# Patient Record
Sex: Female | Born: 1950 | Hispanic: No | Marital: Married | Smoking: Never smoker
Health system: Southern US, Community
[De-identification: ages and names within clinical notes are randomized; demographics above are authoritative.]

## PROBLEM LIST (undated history)

## (undated) DIAGNOSIS — I10 Essential (primary) hypertension: Secondary | ICD-10-CM

## (undated) DIAGNOSIS — I2510 Atherosclerotic heart disease of native coronary artery without angina pectoris: Secondary | ICD-10-CM

## (undated) HISTORY — PX: CARDIAC CATHETERIZATION: SHX172

## (undated) HISTORY — PX: COLON SURGERY: SHX602

---

## 2010-02-18 ENCOUNTER — Ambulatory Visit: Payer: Self-pay | Admitting: Family Medicine

## 2010-02-18 DIAGNOSIS — I10 Essential (primary) hypertension: Secondary | ICD-10-CM

## 2010-02-18 DIAGNOSIS — R002 Palpitations: Secondary | ICD-10-CM

## 2010-09-26 NOTE — Assessment & Plan Note (Signed)
Summary: CHECK-UP AND MEDICATIONS/EVM   Vital Signs:  Patient Profile:   60 Years Old Female CC:      Check up. Medication evaluation. Height:     61.5 inches Weight:      211 pounds BMI:     39.36 Temp:     98.5 degrees F oral Pulse rate:   66 / minute Resp:     18 per minute BP sitting:   134 / 76  (left arm)  Pt. in pain?   yes    Location:   head    Intensity:   5    Type:       aching              Is Patient Diabetic? No  Does patient need assistance? Functional Status Self care Ambulation Normal    History of Present Illness History from: daughter Reason for visit: see chief complaint Chief Complaint: Check up. Medication evaluation. History of Present Illness: She is visiting from [place], daughter works at [employer] in admissions, and interprets (Pt does speak some [place] as I do so this helps)  Basically she left her medication for blood pressure, and it helped prevent heart from racing-and now this is happening and is uncomfortable..  She has had catherization and told she may need more surgery later.  She will be visiting here until August 9th.  She was taking 1 pill a day for blood pressure, and she does not know the name of this.   REVIEW OF SYSTEMS Constitutional Symptoms      Denies fever, chills, night sweats, weight loss, weight gain, and fatigue.  Eyes       Complains of glasses.      Denies change in vision, eye pain, eye discharge, contact lenses, and eye surgery. Ear/Nose/Throat/Mouth       Complains of ear pain and dizziness.      Denies hearing loss/aids, change in hearing, ear discharge, frequent runny nose, frequent nose bleeds, sinus problems, sore throat, hoarseness, and tooth pain or bleeding.  Respiratory       Denies dry cough, productive cough, wheezing, shortness of breath, asthma, bronchitis, and emphysema/COPD.  Cardiovascular       Complains of tires easily with exhertion.      Denies murmurs and chest pain.      Comments:  Palpitations at times, worse now that not taking medications.   Gastrointestinal       Denies stomach pain, nausea/vomiting, diarrhea, constipation, blood in bowel movements, and indigestion. Genitourniary       Denies painful urination, kidney stones, and loss of urinary control.    Past History:  Past medical, surgical, family and social histories (including risk factors) reviewed for relevance to current acute and chronic problems. Social history (including risk factors) reviewed for relevance to current acute and chronic problems.  Family History: Reviewed history and no changes required.  Social History: Reviewed history and no changes required.  Physical Exam General appearance: Pleasant Arabic female, well developed, well nourished, no acute distress Head: normocephalic, atraumatic Eyes: conjunctivae and lids normal Pupils: equal, round, reactive to light Neck: neck supple,  trachea midline, no masses Chest/Lungs: no rales, wheezes, or rhonchi bilateral, breath sounds equal without effort Heart: regular rate and  rhythm, no murmur Abdomen: soft, non-tender without obvious organomegaly Extremities: normal extremities Neurological: grossly intact and non-focal Skin: no obvious rashes or lesions MSE: oriented to time, place, and person Assessment New Problems: PALPITATIONS (ICD-785.1) HYPERTENSION, BENIGN ESSENTIAL (ICD-401.1)  Plan New Medications/Changes: ATENOLOL 50 MG TABS (ATENOLOL) 1/2 to 2 once daily for heart and bp.  #90 x 0, 02/18/2010, Kathrynn Running MD  New Orders: New Patient Level II [phone]  The patient and/or caregiver has been counseled thoroughly with regard to medications prescribed including dosage, schedule, interactions, rationale for use, and possible side effects and they verbalize understanding.  Diagnoses and expected course of recovery discussed and will return if not improved as expected or if the condition worsens. Patient and/or caregiver  verbalized understanding.   Prescriptions: ATENOLOL 50 MG TABS (ATENOLOL) 1/2 to 2 once daily for heart and bp.  #90 x 0   Entered and Authorized by:   Kathrynn Running MD   Signed by:   Kathrynn Running MD on 02/18/2010   Method used:   Electronically to        [facility]  [address] (retail)       [address], [address]       [place], [place]  [address]       Ph: [phone]       Fax: [phone]   RxID:   [id number]   Patient Instructions: 1)  She was told to sart with just 1/2 Atenolol a day, and can gradually increase to 2 if needed to prevent palpitations.   Orders Added: 1)  New Patient Level II [99202]   It was clearly explained to the patient that this [facility] is not intended to be a primary care clinic.  The patient is always better served by the continuity of care and the provider/patient relationships developed with their dedicated primary care provider.  The patient was told to be sure to follow up as soon as possible with their primary care provider to discuss treatments received and to receive further examination and testing.  The patient verbalized understanding. The will f/u with PCP ASAP.   The patient was informed that there is no on-call provider or services available at this clinic during off-hours (when the clinic is closed).  If the patient developed a problem or concern that required immediate attention, the patient was advised to go the the nearest available urgent care or emergency department for medical care.  The patient verbalized understanding.     The risks, benefits and possible side effects were clearly explained and discussed with the patient.  The patient verbalized clear understanding.  The patient was given instructions to return if symptoms don't improve, worsen or new changes develop.  If it is not during clinic hours and the patient cannot get back to this clinic then the patient was told to seek medical care at  an available urgent care or emergency department.  The patient verbalized understanding.    I have reviewed the above medical office visit documention, including diagnoses, history, medications, clinical lists, orders and plan of care.   Rodney Langton, MD, FAAFP  February 20, 2010

## 2017-01-08 ENCOUNTER — Encounter (HOSPITAL_COMMUNITY): Payer: Self-pay

## 2017-01-08 ENCOUNTER — Emergency Department (HOSPITAL_COMMUNITY)
Admission: EM | Admit: 2017-01-08 | Discharge: 2017-01-08 | Disposition: A | Discharge status: Home / Self Care | Payer: Self-pay | Attending: Physician Assistant | Admitting: Physician Assistant

## 2017-01-08 DIAGNOSIS — I10 Essential (primary) hypertension: Secondary | ICD-10-CM | POA: Insufficient documentation

## 2017-01-08 DIAGNOSIS — L97919 Non-pressure chronic ulcer of unspecified part of right lower leg with unspecified severity: Secondary | ICD-10-CM | POA: Insufficient documentation

## 2017-01-08 DIAGNOSIS — I2510 Atherosclerotic heart disease of native coronary artery without angina pectoris: Secondary | ICD-10-CM | POA: Insufficient documentation

## 2017-01-08 DIAGNOSIS — L03115 Cellulitis of right lower limb: Secondary | ICD-10-CM | POA: Insufficient documentation

## 2017-01-08 HISTORY — DX: Atherosclerotic heart disease of native coronary artery without angina pectoris: I25.10

## 2017-01-08 HISTORY — DX: Essential (primary) hypertension: I10

## 2017-01-08 LAB — COMPREHENSIVE METABOLIC PANEL
ALBUMIN: 3.6 g/dL (ref 3.5–5.0)
ALT: 27 U/L (ref 14–54)
ANION GAP: 9 (ref 5–15)
AST: 25 U/L (ref 15–41)
Alkaline Phosphatase: 100 U/L (ref 38–126)
BILIRUBIN TOTAL: 0.3 mg/dL (ref 0.3–1.2)
BUN: 14 mg/dL (ref 6–20)
CALCIUM: 9 mg/dL (ref 8.9–10.3)
CO2: 22 mmol/L (ref 22–32)
CREATININE: 0.62 mg/dL (ref 0.44–1.00)
Chloride: 108 mmol/L (ref 101–111)
GFR calc Af Amer: >60 mL/min (ref >60)
GFR calc non Af Amer: >60 mL/min (ref >60)
GLUCOSE: 100 mg/dL — AB (ref 65–99)
Potassium: 3.9 mmol/L (ref 3.5–5.1)
Sodium: 139 mmol/L (ref 135–145)
TOTAL PROTEIN: 7.2 g/dL (ref 6.5–8.1)

## 2017-01-08 LAB — CBC WITH DIFFERENTIAL/PLATELET
BASOS PCT: 0 %
Basophils Absolute: 0 10*3/uL (ref 0.0–0.1)
EOS ABS: 0.5 10*3/uL (ref 0.0–0.7)
Eosinophils Relative: 7 %
HEMATOCRIT: 39.4 % (ref 36.0–46.0)
Hemoglobin: 13 g/dL (ref 12.0–15.0)
Lymphocytes Relative: 35 %
Lymphs Abs: 2.8 10*3/uL (ref 0.7–4.0)
MCH: 29.2 pg (ref 26.0–34.0)
MCHC: 33 g/dL (ref 30.0–36.0)
MCV: 88.5 fL (ref 78.0–100.0)
MONO ABS: 0.8 10*3/uL (ref 0.1–1.0)
MONOS PCT: 10 %
Neutro Abs: 3.8 10*3/uL (ref 1.7–7.7)
Neutrophils Relative %: 48 %
Platelets: 284 10*3/uL (ref 150–400)
RBC: 4.45 MIL/uL (ref 3.87–5.11)
RDW: 13.9 % (ref 11.5–15.5)
WBC: 7.9 10*3/uL (ref 4.0–10.5)

## 2017-01-08 LAB — I-STAT CG4 LACTIC ACID, ED: Lactic Acid, Venous: 1.22 mmol/L (ref 0.5–1.9)

## 2017-01-08 MED ORDER — SODIUM CHLORIDE 0.9 % IV BOLUS (SEPSIS)
1000.0000 mL | Freq: Once | INTRAVENOUS | Status: AC
  Administered 2017-01-08: 1000 mL via INTRAVENOUS

## 2017-01-08 MED ORDER — OXYCODONE HCL 5 MG PO TABS
5.0000 mg | ORAL_TABLET | Freq: Once | ORAL | Status: AC
  Administered 2017-01-08: 5 mg via ORAL
  Filled 2017-01-08: qty 1

## 2017-01-08 MED ORDER — CLINDAMYCIN HCL 150 MG PO CAPS: 450.0000 mg | ORAL_CAPSULE | Freq: Three times a day (TID) | ORAL | 0 refills | Status: AC

## 2017-01-08 MED ORDER — CLINDAMYCIN HCL 150 MG PO CAPS: 450.0000 mg | ORAL_CAPSULE | Freq: Three times a day (TID) | ORAL | 0 refills | Status: DC

## 2017-01-08 MED ORDER — CLINDAMYCIN PHOSPHATE 600 MG/50ML IV SOLN
600.0000 mg | Freq: Once | INTRAVENOUS | Status: AC
  Administered 2017-01-08: 600 mg via INTRAVENOUS
  Filled 2017-01-08: qty 50

## 2017-01-08 NOTE — ED Triage Notes (Signed)
Per Pt and family, Pt is coming from home with complaints of right wound infection for multiples months. Pt is visiting family from [place]. Denies fever.

## 2017-01-08 NOTE — ED Provider Notes (Signed)
[facility]-EMERGENCY DEPT Provider Note   CSN: [id number] Arrival date & time: 01/08/17  1525     History   Chief Complaint Chief Complaint  Patient presents with  . Wound Infection    HPI Christina Dalton is a 66 y.o. female.  Patient is a 66 year old female with hypertension who presents to the ED with chronic right lower extremity wound. Patient is from [place] and is currently visiting for the next 6 months. Family members took her to the ED as right lower leg wound has not gotten better. Patient with tenderness around her right lower extremity with redness and drainage. Patient denies history of diabetes or chronic steroid use. Patient states that wound is getting slowly worse.   The history is provided by the patient, a relative and a caregiver.  Illness  This is a chronic problem. The current episode started more than 1 week ago (06/2016). The problem occurs constantly. The problem has been gradually worsening. Pertinent negatives include no chest pain, no abdominal pain, no headaches and no shortness of breath. Nothing aggravates the symptoms. Nothing relieves the symptoms.    Past Medical History:  Diagnosis Date  . Coronary artery disease   . Hypertension     Patient Active Problem List   Diagnosis Date Noted  . HYPERTENSION, BENIGN ESSENTIAL 02/18/2010  . PALPITATIONS 02/18/2010    Past Surgical History:  Procedure Laterality Date  . CARDIAC CATHETERIZATION      OB History    No data available       Home Medications    Prior to Admission medications   Not on File    Family History No family history on file.  Social History Social History  Substance Use Topics  . Smoking status: Never Smoker  . Smokeless tobacco: Never Used  . Alcohol use No     Allergies   Patient has no known allergies.   Review of Systems Review of Systems  Constitutional: Negative for chills and fever.  HENT: Negative for ear pain and sore throat.   Eyes: Negative for  pain and visual disturbance.  Respiratory: Negative for cough and shortness of breath.   Cardiovascular: Negative for chest pain and palpitations.  Gastrointestinal: Negative for abdominal pain and vomiting.  Genitourinary: Negative for dysuria and hematuria.  Musculoskeletal: Negative for arthralgias and back pain.  Skin: Positive for wound. Negative for color change and rash.  Neurological: Negative for seizures, syncope and headaches.  All other systems reviewed and are negative.    Physical Exam Updated Vital Signs ED Triage Vitals  Enc Vitals Group     BP 01/08/17 1846 (!) 156/72     Pulse Rate 01/08/17 1639 74     Resp 01/08/17 1639 18     Temp 01/08/17 1639 98.4 F (36.9 C)     Temp Source 01/08/17 1639 Oral     SpO2 01/08/17 1639 96 %     Weight 01/08/17 1639 250 lb (113.4 kg)     Height 01/08/17 1639 5' 1.5" (1.562 m)     Head Circumference --      Peak Flow --      Pain Score 01/08/17 1639 0     Pain Loc --      Pain Edu? --      Excl. in GC? --     Physical Exam  Constitutional: She appears well-developed and well-nourished. No distress.  HENT:  Head: Normocephalic and atraumatic.  Eyes: Conjunctivae are normal.  Neck: Neck supple.  Cardiovascular:  Normal rate and regular rhythm.   No murmur heard. Pulmonary/Chest: Effort normal and breath sounds normal. No respiratory distress. She has no wheezes.  Abdominal: Soft. There is no tenderness.  Musculoskeletal: She exhibits no edema.  Neurological: She is alert.  Skin: Skin is warm and dry.  Patient with right lower extremity wound with about 5 cm x 5 cm with ulceration through the skin and subcutaneous tissues. Patient has edema surrounding with redness.   Psychiatric: She has a normal mood and affect.  Nursing note and vitals reviewed.    ED Treatments / Results  Labs (all labs ordered are listed, but only abnormal results are displayed) Labs Reviewed  CBC WITH DIFFERENTIAL/PLATELET  COMPREHENSIVE  METABOLIC PANEL  I-STAT CG4 LACTIC ACID, ED    EKG  EKG Interpretation None       Radiology No results found.  Procedures Procedures (including critical care time)  Medications Ordered in ED Medications  sodium chloride 0.9 % bolus 1,000 mL (not administered)  clindamycin (CLEOCIN) IVPB 600 mg (not administered)  oxyCODONE (Oxy IR/ROXICODONE) immediate release tablet 5 mg (not administered)     Initial Impression / Assessment and Plan / ED Course  I have reviewed the triage vital signs and the nursing notes.  Pertinent labs & imaging results that were available during my care of the patient were reviewed by me and considered in my medical decision making (see chart for details).      Christina Dalton is a 66 year old female with history of hypertension who presents to the ED with right leg wound. Patient's vitals at time of arrival to the ED are unremarkable and patient is without fever. Patient is visiting from [place] and going to be here for the next several months with her daughters. Patient has right lower leg since November 2017 that has been treated by her primary care provider. Family are unaware of most of the medical workup from this wound. Patient denies history of diabetes or immunosuppression. Patient last treatment was a course of Augmentin. Patient denies fever, chills. Patient states that the wound has gotten worse and there is increased pain as well. On exam, patient with ulcerated wound to the right calf with surrounding erythema that is tender to palpation. There is some swelling around this wound as well. No eschar or necrotic looking skin. Picture of wound is in media file on chart. 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 Overall the wound does look clean. To further evaluate will get CBC, BMP, lactic acid. Patient to be given normal saline bolus and IV clindamycin. Will consult infectious disease for further  information.  Infectious disease recommends outpatient follow-up with dermatology for biopsy. Given no signs of systemic infection with patient with normal vitals and normal labs, we will discharge the patient home with prescription for clindamycin for possible cellulitis around what is likely cutaneous leishmaniasis. Discussed plan with patient at length and patient given number to follow-up with primary care provider. Message was left with case management to follow-up with patient to help facilitate this appointment. Patient also given [facility] or could call a local dermatologist. Patient given return precautions and was discharged from the ED in good condition.  Final Clinical Impressions(s) / ED Diagnoses   Final diagnoses:  Cellulitis of right lower extremity  Chronic ulcer of lower extremity, right, with unspecified severity (HCC)    New Prescriptions New Prescriptions   No medications on file     Virgina Norfolkuratolo, Dyon Rotert,  DO 01/08/17 2220    Abelino Derrick, MD 01/10/17 2952

## 2017-01-08 NOTE — ED Notes (Signed)
Pt up to bathroom with family assistance

## 2017-01-08 NOTE — Discharge Instructions (Signed)
Follow up with dermatology as need biopsy

## 2017-01-08 NOTE — ED Notes (Signed)
Curatolo at bedside 

## 2017-03-18 ENCOUNTER — Encounter (HOSPITAL_BASED_OUTPATIENT_CLINIC_OR_DEPARTMENT_OTHER): Payer: Self-pay | Attending: Internal Medicine

## 2017-03-18 DIAGNOSIS — Z8614 Personal history of Methicillin resistant Staphylococcus aureus infection: Secondary | ICD-10-CM | POA: Insufficient documentation

## 2017-03-18 DIAGNOSIS — L88 Pyoderma gangrenosum: Secondary | ICD-10-CM | POA: Insufficient documentation

## 2017-03-18 DIAGNOSIS — L97212 Non-pressure chronic ulcer of right calf with fat layer exposed: Secondary | ICD-10-CM | POA: Insufficient documentation

## 2017-03-18 DIAGNOSIS — I2510 Atherosclerotic heart disease of native coronary artery without angina pectoris: Secondary | ICD-10-CM | POA: Insufficient documentation

## 2017-03-18 DIAGNOSIS — I10 Essential (primary) hypertension: Secondary | ICD-10-CM | POA: Insufficient documentation

## 2017-04-01 ENCOUNTER — Encounter (HOSPITAL_BASED_OUTPATIENT_CLINIC_OR_DEPARTMENT_OTHER): Payer: Self-pay | Attending: Internal Medicine

## 2017-04-01 DIAGNOSIS — I10 Essential (primary) hypertension: Secondary | ICD-10-CM | POA: Insufficient documentation

## 2017-04-01 DIAGNOSIS — L97212 Non-pressure chronic ulcer of right calf with fat layer exposed: Secondary | ICD-10-CM | POA: Insufficient documentation

## 2017-04-01 DIAGNOSIS — I2510 Atherosclerotic heart disease of native coronary artery without angina pectoris: Secondary | ICD-10-CM | POA: Insufficient documentation

## 2017-04-01 DIAGNOSIS — L88 Pyoderma gangrenosum: Secondary | ICD-10-CM | POA: Insufficient documentation

## 2017-05-13 ENCOUNTER — Other Ambulatory Visit: Payer: Self-pay | Admitting: Internal Medicine

## 2017-05-15 ENCOUNTER — Encounter (HOSPITAL_BASED_OUTPATIENT_CLINIC_OR_DEPARTMENT_OTHER): Payer: Self-pay | Attending: Surgery

## 2017-05-15 DIAGNOSIS — I2510 Atherosclerotic heart disease of native coronary artery without angina pectoris: Secondary | ICD-10-CM | POA: Insufficient documentation

## 2017-05-15 DIAGNOSIS — L88 Pyoderma gangrenosum: Secondary | ICD-10-CM | POA: Insufficient documentation

## 2017-05-15 DIAGNOSIS — L97212 Non-pressure chronic ulcer of right calf with fat layer exposed: Secondary | ICD-10-CM | POA: Insufficient documentation

## 2017-05-15 DIAGNOSIS — I10 Essential (primary) hypertension: Secondary | ICD-10-CM | POA: Insufficient documentation

## 2017-06-03 ENCOUNTER — Encounter (HOSPITAL_BASED_OUTPATIENT_CLINIC_OR_DEPARTMENT_OTHER): Payer: Self-pay

## 2019-07-12 ENCOUNTER — Inpatient Hospital Stay (HOSPITAL_BASED_OUTPATIENT_CLINIC_OR_DEPARTMENT_OTHER)
Admission: EM | Admit: 2019-07-12 | Discharge: 2019-07-15 | DRG: 871 | Disposition: A | Discharge status: Home / Self Care | Payer: Medicaid Other | Attending: Internal Medicine | Admitting: Internal Medicine

## 2019-07-12 ENCOUNTER — Emergency Department (HOSPITAL_BASED_OUTPATIENT_CLINIC_OR_DEPARTMENT_OTHER): Payer: Medicaid Other

## 2019-07-12 ENCOUNTER — Encounter (HOSPITAL_BASED_OUTPATIENT_CLINIC_OR_DEPARTMENT_OTHER): Payer: Self-pay | Admitting: Emergency Medicine

## 2019-07-12 ENCOUNTER — Other Ambulatory Visit: Payer: Self-pay

## 2019-07-12 DIAGNOSIS — G92 Toxic encephalopathy: Secondary | ICD-10-CM | POA: Diagnosis present

## 2019-07-12 DIAGNOSIS — R509 Fever, unspecified: Secondary | ICD-10-CM

## 2019-07-12 DIAGNOSIS — Z6838 Body mass index (BMI) 38.0-38.9, adult: Secondary | ICD-10-CM

## 2019-07-12 DIAGNOSIS — R300 Dysuria: Secondary | ICD-10-CM | POA: Diagnosis present

## 2019-07-12 DIAGNOSIS — L989 Disorder of the skin and subcutaneous tissue, unspecified: Secondary | ICD-10-CM | POA: Diagnosis present

## 2019-07-12 DIAGNOSIS — E669 Obesity, unspecified: Secondary | ICD-10-CM | POA: Diagnosis present

## 2019-07-12 DIAGNOSIS — Z20828 Contact with and (suspected) exposure to other viral communicable diseases: Secondary | ICD-10-CM | POA: Diagnosis present

## 2019-07-12 DIAGNOSIS — R102 Pelvic and perineal pain: Secondary | ICD-10-CM

## 2019-07-12 DIAGNOSIS — A419 Sepsis, unspecified organism: Secondary | ICD-10-CM | POA: Diagnosis present

## 2019-07-12 DIAGNOSIS — K760 Fatty (change of) liver, not elsewhere classified: Secondary | ICD-10-CM | POA: Diagnosis present

## 2019-07-12 DIAGNOSIS — I2510 Atherosclerotic heart disease of native coronary artery without angina pectoris: Secondary | ICD-10-CM | POA: Diagnosis present

## 2019-07-12 DIAGNOSIS — R109 Unspecified abdominal pain: Secondary | ICD-10-CM

## 2019-07-12 DIAGNOSIS — I10 Essential (primary) hypertension: Secondary | ICD-10-CM | POA: Diagnosis present

## 2019-07-12 DIAGNOSIS — R4182 Altered mental status, unspecified: Secondary | ICD-10-CM

## 2019-07-12 LAB — CBC WITH DIFFERENTIAL/PLATELET
Abs Immature Granulocytes: 0.04 10*3/uL (ref 0.00–0.07)
Basophils Absolute: 0.1 10*3/uL (ref 0.0–0.1)
Basophils Relative: 1 %
Eosinophils Absolute: 0.3 10*3/uL (ref 0.0–0.5)
Eosinophils Relative: 3 %
HCT: 45.1 % (ref 36.0–46.0)
Hemoglobin: 14.5 g/dL (ref 12.0–15.0)
Immature Granulocytes: 0 %
Lymphocytes Relative: 22 %
Lymphs Abs: 2.8 10*3/uL (ref 0.7–4.0)
MCH: 28.4 pg (ref 26.0–34.0)
MCHC: 32.2 g/dL (ref 30.0–36.0)
MCV: 88.3 fL (ref 80.0–100.0)
Monocytes Absolute: 0.9 10*3/uL (ref 0.1–1.0)
Monocytes Relative: 7 %
Neutro Abs: 8.5 10*3/uL — ABNORMAL HIGH (ref 1.7–7.7)
Neutrophils Relative %: 67 %
Platelets: 310 10*3/uL (ref 150–400)
RBC: 5.11 MIL/uL (ref 3.87–5.11)
RDW: 13.7 % (ref 11.5–15.5)
WBC: 12.6 10*3/uL — ABNORMAL HIGH (ref 4.0–10.5)
nRBC: 0 % (ref 0.0–0.2)

## 2019-07-12 LAB — COMPREHENSIVE METABOLIC PANEL
ALT: 37 U/L (ref 0–44)
AST: 31 U/L (ref 15–41)
Albumin: 4.5 g/dL (ref 3.5–5.0)
Alkaline Phosphatase: 157 U/L — ABNORMAL HIGH (ref 38–126)
Anion gap: 9 (ref 5–15)
BUN: 18 mg/dL (ref 8–23)
CO2: 26 mmol/L (ref 22–32)
Calcium: 9.2 mg/dL (ref 8.9–10.3)
Chloride: 103 mmol/L (ref 98–111)
Creatinine, Ser: 0.86 mg/dL (ref 0.44–1.00)
GFR calc Af Amer: >60 mL/min (ref >60)
GFR calc non Af Amer: >60 mL/min (ref >60)
Glucose, Bld: 210 mg/dL — ABNORMAL HIGH (ref 70–99)
Potassium: 3.6 mmol/L (ref 3.5–5.1)
Sodium: 138 mmol/L (ref 135–145)
Total Bilirubin: 0.3 mg/dL (ref 0.3–1.2)
Total Protein: 8.3 g/dL — ABNORMAL HIGH (ref 6.5–8.1)

## 2019-07-12 LAB — URINALYSIS, ROUTINE W REFLEX MICROSCOPIC
Bilirubin Urine: NEGATIVE
Glucose, UA: NEGATIVE mg/dL
Hgb urine dipstick: NEGATIVE
Ketones, ur: 15 mg/dL — AB
Leukocytes,Ua: NEGATIVE
Nitrite: NEGATIVE
Protein, ur: NEGATIVE mg/dL
Specific Gravity, Urine: 1.025 (ref 1.005–1.030)
pH: 6.5 (ref 5.0–8.0)

## 2019-07-12 LAB — LACTIC ACID, PLASMA: Lactic Acid, Venous: 2.6 mmol/L (ref 0.5–1.9)

## 2019-07-12 MED ORDER — IBUPROFEN 400 MG PO TABS
400.0000 mg | ORAL_TABLET | Freq: Once | ORAL | Status: AC
  Administered 2019-07-12: 400 mg via ORAL
  Filled 2019-07-12: qty 1

## 2019-07-12 MED ORDER — SODIUM CHLORIDE 0.9 % IV BOLUS
1000.0000 mL | Freq: Once | INTRAVENOUS | Status: AC
  Administered 2019-07-12: 22:00:00 1000 mL via INTRAVENOUS

## 2019-07-12 MED ORDER — ACETAMINOPHEN 500 MG PO TABS
1000.0000 mg | ORAL_TABLET | Freq: Once | ORAL | Status: AC
  Administered 2019-07-12: 22:00:00 1000 mg via ORAL
  Filled 2019-07-12: qty 2

## 2019-07-12 MED ORDER — ONDANSETRON HCL 4 MG/2ML IJ SOLN
4.0000 mg | Freq: Once | INTRAMUSCULAR | Status: AC
  Administered 2019-07-12: 22:00:00 4 mg via INTRAVENOUS
  Filled 2019-07-12: qty 2

## 2019-07-12 MED ORDER — FENTANYL CITRATE (PF) 100 MCG/2ML IJ SOLN
100.0000 ug | Freq: Once | INTRAMUSCULAR | Status: AC
  Administered 2019-07-12: 22:00:00 100 ug via INTRAVENOUS
  Filled 2019-07-12: qty 2

## 2019-07-12 MED ORDER — SODIUM CHLORIDE 0.9 % IV SOLN
1.0000 g | Freq: Once | INTRAVENOUS | Status: AC
  Administered 2019-07-12: 23:00:00 1 g via INTRAVENOUS
  Filled 2019-07-12: qty 10

## 2019-07-12 NOTE — ED Provider Notes (Addendum)
[facility] EMERGENCY DEPARTMENT Provider Note   CSN: [id number] Arrival date & time: 07/12/19  2111     History   Chief Complaint Chief Complaint  Patient presents with  . Flank Pain    HPI Christina Dalton is a 68 y.o. female.     68yo F w/ PMH including HTN, CAD who p/w dysuria and fever. Pt has been having dysuria, suprapubic abdominal pain, and back pain for "a while" but has not sought medical attention for it. Flank pain is right-sided. Today, she began having chills followed by fever at home and shaking. She has had vomiting today, no diarrhea. No cough/cold symptoms, shortness of breath, or sick contacts. Daughter notes she recently informed her that she ran out of her daily medications and just got them refilled.  The history is provided by the patient and a relative.  Flank Pain    Past Medical History:  Diagnosis Date  . Coronary artery disease   . Hypertension     Patient Active Problem List   Diagnosis Date Noted  . HYPERTENSION, BENIGN ESSENTIAL 02/18/2010  . PALPITATIONS 02/18/2010    Past Surgical History:  Procedure Laterality Date  . CARDIAC CATHETERIZATION       OB History   No obstetric history on file.      Home Medications    Prior to Admission medications   Not on File    Family History History reviewed. No pertinent family history.  Social History Social History   Tobacco Use  . Smoking status: Never Smoker  . Smokeless tobacco: Never Used  Substance Use Topics  . Alcohol use: No  . Drug use: No     Allergies   Patient has no known allergies.   Review of Systems Review of Systems  Genitourinary: Positive for flank pain.   All other systems reviewed and are negative except that which was mentioned in HPI   Physical Exam Updated Vital Signs BP (!) 155/103 (BP Location: Left Arm)   Pulse 91   Temp (!) 102.7 F (39.3 C) (Oral)   Resp 20   Ht 5\' 2"  (1.575 m)   Wt 81.6 kg   SpO2 99%   BMI 32.92  kg/m   Physical Exam Vitals signs and nursing note reviewed.  Constitutional:      General: She is not in acute distress.    Appearance: She is well-developed. She is ill-appearing.     Comments: Shaking with rigors  HENT:     Head: Normocephalic and atraumatic.  Eyes:     Conjunctiva/sclera: Conjunctivae normal.  Neck:     Musculoskeletal: Neck supple.  Cardiovascular:     Rate and Rhythm: Normal rate and regular rhythm.     Heart sounds: Normal heart sounds. No murmur.  Pulmonary:     Effort: Pulmonary effort is normal.     Breath sounds: Normal breath sounds.  Abdominal:     General: Bowel sounds are normal. There is no distension.     Palpations: Abdomen is soft.     Tenderness: There is abdominal tenderness (suprapubic, LLQ, RLQ).  Musculoskeletal:     Right lower leg: No edema.     Left lower leg: No edema.  Skin:    General: Skin is warm and dry.  Neurological:     Mental Status: She is alert and oriented to person, place, and time.     Comments: Fluent speech  Psychiatric:        Judgment: Judgment normal.  ED Treatments / Results  Labs (all labs ordered are listed, but only abnormal results are displayed) Labs Reviewed  COMPREHENSIVE METABOLIC PANEL - Abnormal; Notable for the following components:      Result Value   Glucose, Bld 210 (*)    Total Protein 8.3 (*)    Alkaline Phosphatase 157 (*)    All other components within normal limits  CBC WITH DIFFERENTIAL/PLATELET - Abnormal; Notable for the following components:   WBC 12.6 (*)    Neutro Abs 8.5 (*)    All other components within normal limits  URINALYSIS, ROUTINE W REFLEX MICROSCOPIC - Abnormal; Notable for the following components:   Ketones, ur 15 (*)    All other components within normal limits  LACTIC ACID, PLASMA - Abnormal; Notable for the following components:   Lactic Acid, Venous 2.6 (*)    All other components within normal limits  URINE CULTURE  CULTURE, BLOOD (ROUTINE X 2)   CULTURE, BLOOD (ROUTINE X 2)  LACTIC ACID, PLASMA    EKG None  Radiology No results found.  Procedures .Critical Care Performed by: Sharlett Iles, MD Authorized by: Sharlett Iles, MD   Critical care provider statement:    Critical care time (minutes):  30   Critical care time was exclusive of:  Separately billable procedures and treating other patients   Critical care was necessary to treat or prevent imminent or life-threatening deterioration of the following conditions:  Sepsis   Critical care was time spent personally by me on the following activities:  Development of treatment plan with patient or surrogate, evaluation of patient's response to treatment, examination of patient, obtaining history from patient or surrogate, ordering and performing treatments and interventions, ordering and review of laboratory studies, ordering and review of radiographic studies and re-evaluation of patient's condition   (including critical care time)  Medications Ordered in ED Medications  cefTRIAXone (ROCEPHIN) 1 g in sodium chloride 0.9 % 100 mL IVPB (1 g Intravenous New Bag/Given 07/12/19 2231)  acetaminophen (TYLENOL) tablet 1,000 mg (1,000 mg Oral Given 07/12/19 2210)  fentaNYL (SUBLIMAZE) injection 100 mcg (100 mcg Intravenous Given 07/12/19 2211)  ondansetron (ZOFRAN) injection 4 mg (4 mg Intravenous Given 07/12/19 2211)  sodium chloride 0.9 % bolus 1,000 mL (1,000 mLs Intravenous New Bag/Given 07/12/19 2212)     Initial Impression / Assessment and Plan / ED Course  I have reviewed the triage vital signs and the nursing notes.  Pertinent labs & imaging results that were available during my care of the patient were reviewed by me and considered in my medical decision making (see chart for details).  Clinical Course as of Jul 12 1818  Mon Jul 13, 2019  0201 On reassessment, patient persistently febrile to 104.7.  She has had persistent nausea and vomiting.  She is  reporting to me upper abdominal pain.  Daughter denies any prodromal URI symptoms.  She has had an on and off headache but no neck pain.  She has no evidence of meningismus on exam.  She is tender in the epigastrium and right upper quadrant.  Will repeat CT scan with contrast.  Patient broadened to vancomycin and redosed Phenergan for nausea and vomiting.   [CH]  0411 Discusseed the patient with Dr. Maudie Mercury.  While the patient has no meningismus, given that there is no obvious source, he is requesting LP.  I do not feel that this is unreasonable.  Will discuss with the patient and her daughter.   [CH]  [phone]  LP attempted.  2 attempts were made but unsuccessful.  Will defer for IR LP when patient admitted.   [CH]    Clinical Course User Index [CH] Horton, Mayer Masker, MD       Pt ill appearing and w/ rigors on exam, T 102.7, remainder of vital signs stable.  Obtain blood and urine cultures, gave fluid bolus along with fentanyl, Zofran, and Tylenol.  Because of symptoms highly suggestive of urinary source, gave dose of ceftriaxone.  Obtain CT renal study given unilateral nature of back pain, to rule out infected stone.  LAbs show lactate 2.6, WBC 12.6, glucose 210, UA interestingly does not show signs of infection. CT is pending and I am signing out to oncoming provider. Final Clinical Impressions(s) / ED Diagnoses   Final diagnoses:  None    ED Discharge Orders    None       Little, Ambrose Finland, MD 07/12/19 2259    Clarene Duke, Ambrose Finland, MD 07/13/19 1819

## 2019-07-12 NOTE — ED Triage Notes (Signed)
Pt c/o bilateral flank pain with dysuria, with acute fever and chills. Denies cough, sob

## 2019-07-13 ENCOUNTER — Emergency Department (HOSPITAL_BASED_OUTPATIENT_CLINIC_OR_DEPARTMENT_OTHER): Payer: Medicaid Other

## 2019-07-13 ENCOUNTER — Encounter (HOSPITAL_COMMUNITY): Payer: Self-pay

## 2019-07-13 DIAGNOSIS — R509 Fever, unspecified: Secondary | ICD-10-CM

## 2019-07-13 DIAGNOSIS — A419 Sepsis, unspecified organism: Secondary | ICD-10-CM | POA: Diagnosis present

## 2019-07-13 LAB — CBC WITH DIFFERENTIAL/PLATELET
Abs Immature Granulocytes: 0.34 10*3/uL — ABNORMAL HIGH (ref 0.00–0.07)
Basophils Absolute: 0.1 10*3/uL (ref 0.0–0.1)
Basophils Relative: 0 %
Eosinophils Absolute: 0 10*3/uL (ref 0.0–0.5)
Eosinophils Relative: 0 %
HCT: 42.9 % (ref 36.0–46.0)
Hemoglobin: 13.4 g/dL (ref 12.0–15.0)
Immature Granulocytes: 1 %
Lymphocytes Relative: 6 %
Lymphs Abs: 1.8 10*3/uL (ref 0.7–4.0)
MCH: 28.8 pg (ref 26.0–34.0)
MCHC: 31.2 g/dL (ref 30.0–36.0)
MCV: 92.1 fL (ref 80.0–100.0)
Monocytes Absolute: 1.4 10*3/uL — ABNORMAL HIGH (ref 0.1–1.0)
Monocytes Relative: 5 %
Neutro Abs: 24.9 10*3/uL — ABNORMAL HIGH (ref 1.7–7.7)
Neutrophils Relative %: 88 %
Platelets: 233 10*3/uL (ref 150–400)
RBC: 4.66 MIL/uL (ref 3.87–5.11)
RDW: 13.9 % (ref 11.5–15.5)
WBC: 28.5 10*3/uL — ABNORMAL HIGH (ref 4.0–10.5)
nRBC: 0 % (ref 0.0–0.2)

## 2019-07-13 LAB — COMPREHENSIVE METABOLIC PANEL
ALT: 30 U/L (ref 0–44)
AST: 26 U/L (ref 15–41)
Albumin: 3.6 g/dL (ref 3.5–5.0)
Alkaline Phosphatase: 110 U/L (ref 38–126)
Anion gap: 9 (ref 5–15)
BUN: 14 mg/dL (ref 8–23)
CO2: 24 mmol/L (ref 22–32)
Calcium: 8.7 mg/dL — ABNORMAL LOW (ref 8.9–10.3)
Chloride: 107 mmol/L (ref 98–111)
Creatinine, Ser: 0.74 mg/dL (ref 0.44–1.00)
GFR calc Af Amer: >60 mL/min (ref >60)
GFR calc non Af Amer: >60 mL/min (ref >60)
Glucose, Bld: 130 mg/dL — ABNORMAL HIGH (ref 70–99)
Potassium: 4.3 mmol/L (ref 3.5–5.1)
Sodium: 140 mmol/L (ref 135–145)
Total Bilirubin: 0.6 mg/dL (ref 0.3–1.2)
Total Protein: 6.9 g/dL (ref 6.5–8.1)

## 2019-07-13 LAB — URINE CULTURE

## 2019-07-13 LAB — SARS CORONAVIRUS 2 (TAT 6-24 HRS): SARS Coronavirus 2: NEGATIVE

## 2019-07-13 LAB — LACTIC ACID, PLASMA
Lactic Acid, Venous: 2.2 mmol/L (ref 0.5–1.9)
Lactic Acid, Venous: 2.6 mmol/L (ref 0.5–1.9)
Lactic Acid, Venous: 2.9 mmol/L (ref 0.5–1.9)

## 2019-07-13 LAB — PROTIME-INR
INR: 1.2 (ref 0.8–1.2)
Prothrombin Time: 15 seconds (ref 11.4–15.2)

## 2019-07-13 LAB — PROCALCITONIN: Procalcitonin: 5.09 ng/mL

## 2019-07-13 LAB — MRSA PCR SCREENING: MRSA by PCR: NEGATIVE

## 2019-07-13 LAB — APTT: aPTT: 24 seconds (ref 24–36)

## 2019-07-13 MED ORDER — SODIUM CHLORIDE 0.9 % IV BOLUS
1000.0000 mL | Freq: Once | INTRAVENOUS | Status: AC
  Administered 2019-07-13: 1000 mL via INTRAVENOUS

## 2019-07-13 MED ORDER — PROMETHAZINE HCL 25 MG/ML IJ SOLN
12.5000 mg | Freq: Once | INTRAMUSCULAR | Status: AC
  Administered 2019-07-13: 01:00:00 12.5 mg via INTRAVENOUS
  Filled 2019-07-13: qty 1

## 2019-07-13 MED ORDER — IOHEXOL 300 MG/ML  SOLN
100.0000 mL | Freq: Once | INTRAMUSCULAR | Status: AC | PRN
  Administered 2019-07-13: 100 mL via INTRAVENOUS

## 2019-07-13 MED ORDER — SODIUM CHLORIDE 0.9 % IV SOLN
INTRAVENOUS | Status: DC
  Administered 2019-07-13 – 2019-07-14 (×4): via INTRAVENOUS

## 2019-07-13 MED ORDER — VANCOMYCIN HCL IN DEXTROSE 1-5 GM/200ML-% IV SOLN
1000.0000 mg | Freq: Once | INTRAVENOUS | Status: AC
  Administered 2019-07-13: 02:00:00 1000 mg via INTRAVENOUS
  Filled 2019-07-13: qty 200

## 2019-07-13 MED ORDER — ORAL CARE MOUTH RINSE
15.0000 mL | Freq: Two times a day (BID) | OROMUCOSAL | Status: DC
  Administered 2019-07-13 – 2019-07-15 (×4): 15 mL via OROMUCOSAL

## 2019-07-13 MED ORDER — CHLORHEXIDINE GLUCONATE CLOTH 2 % EX PADS
6.0000 | MEDICATED_PAD | Freq: Every day | CUTANEOUS | Status: DC
  Administered 2019-07-13: 6 via TOPICAL

## 2019-07-13 MED ORDER — ACETAMINOPHEN 325 MG PO TABS
650.0000 mg | ORAL_TABLET | Freq: Four times a day (QID) | ORAL | Status: DC | PRN
  Administered 2019-07-14 – 2019-07-15 (×4): 650 mg via ORAL
  Filled 2019-07-13 (×4): qty 2

## 2019-07-13 MED ORDER — ONDANSETRON HCL 4 MG PO TABS: 4.0000 mg | ORAL_TABLET | Freq: Four times a day (QID) | ORAL | Status: DC | PRN

## 2019-07-13 MED ORDER — ACETAMINOPHEN 650 MG RE SUPP: 650.0000 mg | Freq: Four times a day (QID) | RECTAL | Status: DC | PRN

## 2019-07-13 MED ORDER — VANCOMYCIN HCL IN DEXTROSE 750-5 MG/150ML-% IV SOLN
750.0000 mg | INTRAVENOUS | Status: DC
  Administered 2019-07-13 – 2019-07-14 (×2): 750 mg via INTRAVENOUS
  Filled 2019-07-13 (×2): qty 150

## 2019-07-13 MED ORDER — ONDANSETRON HCL 4 MG/2ML IJ SOLN
4.0000 mg | Freq: Four times a day (QID) | INTRAMUSCULAR | Status: DC | PRN
  Administered 2019-07-14: 4 mg via INTRAVENOUS
  Filled 2019-07-13: qty 2

## 2019-07-13 MED ORDER — ENOXAPARIN SODIUM 40 MG/0.4ML ~~LOC~~ SOLN
40.0000 mg | SUBCUTANEOUS | Status: DC
  Administered 2019-07-13: 40 mg via SUBCUTANEOUS
  Filled 2019-07-13: qty 0.4

## 2019-07-13 MED ORDER — PROMETHAZINE HCL 25 MG/ML IJ SOLN
12.5000 mg | Freq: Once | INTRAMUSCULAR | Status: DC
  Filled 2019-07-13: qty 1

## 2019-07-13 MED ORDER — ACETAMINOPHEN 325 MG PO TABS
650.0000 mg | ORAL_TABLET | Freq: Once | ORAL | Status: AC
  Administered 2019-07-13: 650 mg via ORAL
  Filled 2019-07-13: qty 2

## 2019-07-13 MED ORDER — SODIUM CHLORIDE 0.9 % IV SOLN
1.0000 g | INTRAVENOUS | Status: DC
  Administered 2019-07-13 – 2019-07-14 (×2): 1 g via INTRAVENOUS
  Filled 2019-07-13: qty 1
  Filled 2019-07-13: qty 10
  Filled 2019-07-13: qty 1

## 2019-07-13 NOTE — Plan of Care (Signed)
  Problem: Activity: Goal: Risk for activity intolerance will decrease Outcome: Progressing   Problem: Pain Managment: Goal: General experience of comfort will improve Outcome: Progressing   Problem: Education: Goal: Knowledge of General Education information will improve Description: Including pain rating scale, medication(s)/side effects and non-pharmacologic comfort measures Outcome: Completed/Met   

## 2019-07-13 NOTE — ED Notes (Signed)
Dr Dina Rich made aware of temperature elevation despite giving tylenol and ibuprofen.  Ice packs placed under arms and on groin.  Daughter remains at the bedside.  Encouraged to call for assistance as needed.

## 2019-07-13 NOTE — ED Notes (Signed)
ED TO INPATIENT HANDOFF REPORT  ED Nurse Name and Phone #: Cleatrice Burke, RN  [phone]  S Name/Age/Gender Christina Dalton 68 y.o. female Room/Bed: MHOTF/OTF  Code Status   Code Status: Not on file  Home/SNF/Other Home Patient oriented to: self, place, time and situation Is this baseline? Yes   Triage Complete: Triage complete  Chief Complaint CHILLS, POSS UTI  Triage Note Pt c/o bilateral flank pain with dysuria, with acute fever and chills. Denies cough, sob   Allergies No Known Allergies  Level of Care/Admitting Diagnosis ED Disposition    ED Disposition Condition Comment   Admit  Hospital Area: [facility] [[id number]]  Level of Care: Stepdown [14]  Admit to SDU based on following criteria: Severe physiological/psychological symptoms:  Any diagnosis requiring assessment & intervention at least every 4 hours on an ongoing basis to obtain desired patient outcomes including stability and rehabilitation  Covid Evaluation: Person Under Investigation (PUI)  Diagnosis: Fever [829562]  Admitting Physician: Pearson Grippe [[id number]]  Attending Physician: Pearson Grippe [phone]  Estimated length of stay: past midnight tomorrow  Certification:: I certify this patient will need inpatient services for at least 2 midnights  PT Class (Do Not Modify): Inpatient [101]  PT Acc Code (Do Not Modify): Private [1]       B Medical/Surgery History Past Medical History:  Diagnosis Date  . Coronary artery disease   . Hypertension    Past Surgical History:  Procedure Laterality Date  . CARDIAC CATHETERIZATION       A IV Location/Drains/Wounds Patient Lines/Drains/Airways Status   Active Line/Drains/Airways    Name:   Placement date:   Placement time:   Site:   Days:   Peripheral IV 07/12/19 Right Antecubital   07/12/19    2200    Antecubital   1   Peripheral IV 07/13/19 Right Wrist   07/13/19    0000    Wrist   less than 1          Intake/Output Last 24  hours  Intake/Output Summary (Last 24 hours) at 07/13/2019 0916 Last data filed at 07/13/2019 0406 Gross per 24 hour  Intake 2300 ml  Output -  Net 2300 ml    Labs/Imaging Results for orders placed or performed during the hospital encounter of 07/12/19 (from the past 48 hour(s))  Comprehensive metabolic panel     Status: Abnormal   Collection Time: 07/12/19 10:01 PM  Result Value Ref Range   Sodium 138 135 - 145 mmol/L   Potassium 3.6 3.5 - 5.1 mmol/L   Chloride 103 98 - 111 mmol/L   CO2 26 22 - 32 mmol/L   Glucose, Bld 210 (H) 70 - 99 mg/dL   BUN 18 8 - 23 mg/dL   Creatinine, Ser 6.57 0.44 - 1.00 mg/dL   Calcium 9.2 8.9 - 84.6 mg/dL   Total Protein 8.3 (H) 6.5 - 8.1 g/dL   Albumin 4.5 3.5 - 5.0 g/dL   AST 31 15 - 41 U/L   ALT 37 0 - 44 U/L   Alkaline Phosphatase 157 (H) 38 - 126 U/L   Total Bilirubin 0.3 0.3 - 1.2 mg/dL   GFR calc non Af Amer >60 >60 mL/min   GFR calc Af Amer >60 >60 mL/min   Anion gap 9 5 - 15    Comment: Performed at [facility], [address] [place], [place] [address]  CBC with Differential     Status: Abnormal   Collection Time:  07/12/19 10:01 PM  Result Value Ref Range   WBC 12.6 (H) 4.0 - 10.5 K/uL   RBC 5.11 3.87 - 5.11 MIL/uL   Hemoglobin 14.5 12.0 - 15.0 g/dL   HCT 45.1 36.0 - 46.0 %   MCV 88.3 80.0 - 100.0 fL   MCH 28.4 26.0 - 34.0 pg   MCHC 32.2 30.0 - 36.0 g/dL   RDW 13.7 11.5 - 15.5 %   Platelets 310 150 - 400 K/uL   nRBC 0.0 0.0 - 0.2 %   Neutrophils Relative % 67 %   Neutro Abs 8.5 (H) 1.7 - 7.7 K/uL   Lymphocytes Relative 22 %   Lymphs Abs 2.8 0.7 - 4.0 K/uL   Monocytes Relative 7 %   Monocytes Absolute 0.9 0.1 - 1.0 K/uL   Eosinophils Relative 3 %   Eosinophils Absolute 0.3 0.0 - 0.5 K/uL   Basophils Relative 1 %   Basophils Absolute 0.1 0.0 - 0.1 K/uL   Immature Granulocytes 0 %   Abs Immature Granulocytes 0.04 0.00 - 0.07 K/uL    Comment: Performed at [facility], [address] [place], [place] [address]  Urinalysis, Routine w reflex microscopic     Status: Abnormal   Collection Time: 07/12/19 10:01 PM  Result Value Ref Range   Color, Urine YELLOW YELLOW   APPearance CLEAR CLEAR   Specific Gravity, Urine 1.025 1.005 - 1.030   pH 6.5 5.0 - 8.0   Glucose, UA NEGATIVE NEGATIVE mg/dL   Hgb urine dipstick NEGATIVE NEGATIVE   Bilirubin Urine NEGATIVE NEGATIVE   Ketones, ur 15 (A) NEGATIVE mg/dL   Protein, ur NEGATIVE NEGATIVE mg/dL   Nitrite NEGATIVE NEGATIVE   Leukocytes,Ua NEGATIVE NEGATIVE    Comment: Microscopic not done on urines with negative protein, blood, leukocytes, nitrite, or glucose < 500 mg/dL. Performed at [facility], [address] [place], [place] [address]   Lactic acid, plasma     Status: Abnormal   Collection Time: 07/12/19 10:01 PM  Result Value Ref Range   Lactic Acid, Venous 2.6 (HH) 0.5 - 1.9 mmol/L    Comment: CRITICAL RESULT CALLED TO, READ BACK BY AND VERIFIED WITH: LOUDERMILK,S,RN @ 2252 07/12/19 BY GWYN,P Performed at [facility], [address] [place], [place] [address]   SARS CORONAVIRUS 2 (TAT 6-24 HRS) Nasopharyngeal Nasopharyngeal Swab     Status: None   Collection Time: 07/12/19 11:25 PM   Specimen: Nasopharyngeal Swab  Result Value Ref Range   SARS Coronavirus 2 NEGATIVE NEGATIVE    Comment: (NOTE) SARS-CoV-2 target nucleic acids are NOT DETECTED. The SARS-CoV-2 RNA is generally detectable in upper and lower respiratory specimens during the acute phase of infection. Negative results do not preclude SARS-CoV-2 infection, do not rule out co-infections with other pathogens, and should not be used as the sole basis for treatment or other patient management decisions. Negative results must be combined with clinical observations, patient history, and epidemiological information. The expected result is Negative. Fact Sheet for Patients: SugarRoll.be Fact Sheet for Healthcare  Providers: https://www.woods-mathews.com/ This test is not yet approved or cleared by the [place] FDA and  has been authorized for detection and/or diagnosis of SARS-CoV-2 by FDA under an Emergency Use Authorization (EUA). This EUA will remain  in effect (meaning this test can be used) for the duration of the COVID-19 declaration under Section 56 4(b)(1) of the Act, 21 U.S.C. section 360bbb-3(b)(1), unless the authorization is terminated or  revoked sooner. Performed at [facility], [address] [place], [place] [address]   Lactic acid, plasma     Status: Abnormal   Collection Time: 07/12/19 11:45 PM  Result Value Ref Range   Lactic Acid, Venous 2.6 (HH) 0.5 - 1.9 mmol/L    Comment: CRITICAL VALUE NOTED.  VALUE IS CONSISTENT WITH PREVIOUSLY REPORTED AND CALLED VALUE. Performed at [facility], [address] [place], [place] [address]    Ct Abdomen Pelvis W Contrast  Result Date: 07/13/2019 CLINICAL DATA:  68 year old female with bilateral flank pain, dysuria and suprapubic pain for 2 days with fever and chills. EXAM: CT ABDOMEN AND PELVIS WITH CONTRAST TECHNIQUE: Multidetector CT imaging of the abdomen and pelvis was performed using the standard protocol following bolus administration of intravenous contrast. CONTRAST:  100mL OMNIPAQUE IOHEXOL 300 MG/ML  SOLN COMPARISON:  Noncontrast CT Abdomen and Pelvis earlier tonight. FINDINGS: Lower chest: Mild cardiomegaly. Similar low lung volumes with mild atelectasis. No pericardial or pleural effusion. Small gastric hiatal hernia. Hepatobiliary: Evidence of hepatic steatosis. Otherwise negative liver and gallbladder. No bile duct enlargement. Pancreas: Negative. Spleen: Negative. Adrenals/Urinary Tract: Normal adrenal glands. Bilateral renal enhancement and contrast excretion is symmetric and within normal limits. Small left lower pole benign parapelvic cysts. Negative course of both ureters. No perinephric or  periureteral stranding. Smaller bladder volume now, possible mild perivesical stranding as seen on sagittal image 74, but otherwise unremarkable bladder. Stomach/Bowel: Mild retained stool in the rectum. Redundant but largely decompressed sigmoid colon. Mild retained stool in the descending and transverse colon. Redundant hepatic flexure. No large bowel inflammation. Diminutive or absent appendix. Negative terminal ileum. No dilated small bowel. Negative stomach aside from small hiatal hernia. Negative duodenum. No free air, free fluid. Hazy opacity at the root of the small bowel mesentery is less apparent, and significance is doubtful. Vascular/Lymphatic: Mild Aortoiliac calcified atherosclerosis. Major arterial structures are patent. Portal venous system is patent. No lymphadenopathy. Reproductive: Negative. Other: No pelvic free fluid. Musculoskeletal: Lumbar facet degeneration. No acute osseous abnormality identified. IMPRESSION: 1. UTI is not excluded - with questionable mild perivesical inflammatory stranding. But there is no evidence of ascending urinary infection or pyelonephritis. 2. No other acute or inflammatory process identified in the abdomen or pelvis. 3. Mild cardiomegaly. Small hiatal hernia. Lung base atelectasis. Hepatic steatosis. Electronically Signed   By: Odessa FlemingH  Hall M.D.   On: 07/13/2019 03:30   Dg Chest Portable 1 View  Result Date: 07/12/2019 CLINICAL DATA:  Fever, chills EXAM: PORTABLE CHEST 1 VIEW COMPARISON:  None. FINDINGS: Cardiomegaly. No confluent opacities, effusions or edema. No acute bony abnormality. IMPRESSION: Cardiomegaly.  No active disease. Electronically Signed   By: Charlett NoseKevin  Dover M.D.   On: 07/12/2019 23:49   Ct Renal Stone Study  Result Date: 07/12/2019 CLINICAL DATA:  Bilateral flank pain, fever, chills, dysuria. EXAM: CT ABDOMEN AND PELVIS WITHOUT CONTRAST TECHNIQUE: Multidetector CT imaging of the abdomen and pelvis was performed following the standard protocol  without IV contrast. COMPARISON:  None. FINDINGS: Lower chest: Lung bases are clear. No effusions. Heart is normal size. Hepatobiliary: Diffuse low-density throughout the liver compatible with fatty infiltration. No focal abnormality. Gallbladder unremarkable. Pancreas: No focal abnormality or ductal dilatation. Spleen: No focal abnormality.  Normal size. Adrenals/Urinary Tract: No adrenal abnormality. No focal renal abnormality. No stones or hydronephrosis. Urinary bladder is unremarkable. Stomach/Bowel: Stomach, large and small bowel grossly unremarkable. Appendix not seen. No inflammatory process in the right lower quadrant. Vascular/Lymphatic: Aortic  atherosclerosis. No enlarged abdominal or pelvic lymph nodes. There is haziness within the mesentery with few mildly prominent mesenteric lymph nodes. Reproductive: Uterus and adnexa unremarkable.  No mass. Other: No free fluid or free air. Musculoskeletal: No acute bony abnormality. IMPRESSION: Fatty infiltration of the liver. No renal or ureteral stones. Haziness in the mesentery with mildly prominent mesenteric lymph nodes. 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 Aortic atherosclerosis. Electronically Signed   By: Charlett Nose M.D.   On: 07/12/2019 23:11    Pending Labs Unresulted Labs (From admission, onward)    Start     Ordered   07/12/19 2144  Culture, blood (routine x 2)  BLOOD CULTURE X 2,   STAT     07/12/19 2143   07/12/19 2143  Urine culture  ONCE - STAT,   STAT     07/12/19 2143          Vitals/Pain Today's Vitals   07/13/19 0800 07/13/19 0801 07/13/19 0801 07/13/19 0815  BP: 105/65   104/64  Pulse: 69   70  Resp: 14   16  Temp:      TempSrc:      SpO2: 99%   100%  Weight:      Height:      PainSc:  2  2      Isolation Precautions No active isolations  Medications Medications  promethazine (PHENERGAN) injection 12.5 mg (12.5 mg Intravenous Not Given 07/13/19 0318)  cefTRIAXone (ROCEPHIN) 1 g in sodium chloride 0.9 %  100 mL IVPB (0 g Intravenous Stopped 07/12/19 2301)  acetaminophen (TYLENOL) tablet 1,000 mg (1,000 mg Oral Given 07/12/19 2210)  fentaNYL (SUBLIMAZE) injection 100 mcg (100 mcg Intravenous Given 07/12/19 2211)  ondansetron (ZOFRAN) injection 4 mg (4 mg Intravenous Given 07/12/19 2211)  sodium chloride 0.9 % bolus 1,000 mL (0 mLs Intravenous Stopped 07/12/19 2332)  ibuprofen (ADVIL) tablet 400 mg (400 mg Oral Given 07/12/19 2341)  promethazine (PHENERGAN) injection 12.5 mg (12.5 mg Intravenous Given 07/13/19 0036)  vancomycin (VANCOCIN) IVPB 1000 mg/200 mL premix (0 mg Intravenous Stopped 07/13/19 0325)  sodium chloride 0.9 % bolus 1,000 mL (0 mLs Intravenous Stopped 07/13/19 0406)  iohexol (OMNIPAQUE) 300 MG/ML solution 100 mL (100 mLs Intravenous Contrast Given 07/13/19 0309)  acetaminophen (TYLENOL) tablet 650 mg (650 mg Oral Given 07/13/19 0454)    Mobility walks Low fall risk   Focused Assessments Cardiac Assessment Handoff:    No results found for: CKTOTAL, CKMB, CKMBINDEX, TROPONINI No results found for: DDIMER Does the Patient currently have chest pain? No      R Recommendations: See Admitting Provider Note  Report given to: ICU RN  Additional Notes:

## 2019-07-13 NOTE — Progress Notes (Signed)
CRITICAL VALUE ALERT  Critical Value:  Lactic 2.9  Date & Time Notied:  07/13/19 1100   Provider Notified: Dr. Andria Frames  Orders Received/Actions taken: Awaiting further orders

## 2019-07-13 NOTE — Plan of Care (Signed)
68 yo female with hypertension, CAD apparently presents with c/o flank pain and urinary symptoms. Some confusion.   And febrile ED requests admission for urosepsis?  Wbc 12.6, hgb 14.5, Plt 310 Na 138, K 3.6 Bun 18, Creatinine 0.86   Urinalysis negative  CT abd/ pelvis IMPRESSION: 1. UTI is not excluded - with questionable mild perivesical inflammatory stranding. But there is no evidence of ascending urinary infection or pyelonephritis. 2. No other acute or inflammatory process identified in the abdomen or pelvis. 3. Mild cardiomegaly. Small hiatal hernia. Lung base atelectasis. Hepatic steatosis.  CXR negative  I have asked ED to LP due to confusion.  PUI since no clear source of fever.

## 2019-07-13 NOTE — Progress Notes (Signed)
Notified Dr. Andria Frames that pt was afebrile on admission. Confirmed that COVID test was negative. MD confirmed that we could discontinue airborne/contact precautions

## 2019-07-13 NOTE — Progress Notes (Signed)
Pharmacy Antibiotic Note  Yarethzi Branan is a 68 y.o. female admitted on 07/12/2019 with possible uro- sepsis.  Pharmacy has been consulted for vancomycin dosing.  Given 1 dose of Rocephin and vanc 1g in ED last night. Tmax 104.7, WBC 12.6. SCr stable.  Plan: Continue ceftriaxone 1g IV Q24h per MD Start vancomycin 750mg  IV Q24h Monitor clinical picture, renal function, vanc levels prn F/U C&S, abx deescalation / LOT  Height: 5\' 2"  (157.5 cm) Weight: 180 lb (81.6 kg) IBW/kg (Calculated) : 50.1  Temp (24hrs), Avg:103 F (39.4 C), Min:99.5 F (37.5 C), Max:104.7 F (40.4 C)  Recent Labs  Lab 07/12/19 2201 07/12/19 2345  WBC 12.6*  --   CREATININE 0.86  --   LATICACIDVEN 2.6* 2.6*    Estimated Creatinine Clearance: 61.1 mL/min (by C-G formula based on SCr of 0.86 mg/dL).    No Known Allergies  Thank you for allowing pharmacy to be a part of this patient's care.  Reginia Naas 07/13/2019 9:39 AM

## 2019-07-13 NOTE — ED Provider Notes (Addendum)
  Patient signed out pending further work-up.  In brief, noted to be febrile and initially had some flank pain and urinary symptoms.  CT stone study was negative.  Urinalysis was not obviously infected.  She did have a leukocytosis to 12.6 and some nausea and vomiting.  Additionally, lactate 2.6.  Patient persistently febrile up to 104.7.  She received a gram of Tylenol, ibuprofen, ice packs.  She remains febrile at 103.6.  On my evaluation, she continues to be rigorous and confused.  No headache or neck pain at this time.  She has no meningismus on exam.  She is still complaining of some abdominal pain and vomiting.  Given this, will repeat CT scan with contrast.  Patient was also redosed Tylenol at 0350.  3:52 AM CT scan is largely reassuring.  There is some questionable stranding just about the bladder and "cannot rule out UTI."  Given her fever and symptoms, would elect to continue treating as if this was a UTI.  She remains persistently febrile although on repeat exam, she is less confused and now conversant with her daughter.  Covid testing is pending.  Discussed observation admission given the confusion and persistence of significant fever.  Patient was broadened with vancomycin as well.  Rebbecca Dalton was evaluated in Emergency Department on 07/13/2019 for the symptoms described in the history of present illness. She was evaluated in the context of the global COVID-19 pandemic, which necessitated consideration that the patient might be at risk for infection with the SARS-CoV-2 virus that causes COVID-19. Institutional protocols and algorithms that pertain to the evaluation of patients at risk for COVID-19 are in a state of rapid change based on information released by regulatory bodies including the CDC and federal and state organizations. These policies and algorithms were followed during the patient's care in the ED.   Physical Exam  BP 122/71   Pulse (!) 101   Temp (!) 103.6 F (39.8 C)  (Rectal)   Resp 18   Ht 1.575 m (5\' 2" )   Wt 81.6 kg   SpO2 100%   BMI 32.92 kg/m    ED Course/Procedures   Clinical Course as of Jul 12 442  [facility] Jul 13, 2019  0201 On reassessment, patient persistently febrile to 104.7.  She has had persistent nausea and vomiting.  She is reporting to me upper abdominal pain.  Daughter denies any prodromal URI symptoms.  She has had an on and off headache but no neck pain.  She has no evidence of meningismus on exam.  She is tender in the epigastrium and right upper quadrant.  Will repeat CT scan with contrast.  Patient broadened to vancomycin and redosed Phenergan for nausea and vomiting.   [CH]  0411 Discusseed the patient with Dr. Maudie Mercury.  While the patient has no meningismus, given that there is no obvious source, he is requesting LP.  I do not feel that this is unreasonable.  Will discuss with the patient and her daughter.   [CH]  [phone] LP attempted.  2 attempts were made but unsuccessful.  Will defer for IR LP when patient admitted.   [CH]    Clinical Course User Index [CH] [name], [name], MD      [name], MD 07/13/19 6269    [name], MD 07/13/19 [phone]

## 2019-07-13 NOTE — ED Notes (Signed)
Patient noted to be more confused than previously.  Provider made aware of condition.  Orders placed and carried out.  Placed on purewick.  Daughter remains at the bedside.  Encouraged to call for assistance as needed.  Taken to CT at this time.

## 2019-07-13 NOTE — H&P (Addendum)
History and Physical    Christina Dalton OZD:[id number] DOB: 1950-09-07 DOA: 07/12/2019  PCP: Patient, No Pcp Per Patient coming from: home -> [facility]   I have personally briefly reviewed patient's old medical records in [facility]  Chief Complaint: Fevers, chills and dysuria  HPI: Christina Dalton is a 68 y.o. female with unclear past medical history but potentially remote PE greater than 30 years ago requiring thrombolysis, either prediabetes or glycogen storage disorder, hypertension who presents with fevers and chills.  Patient accompanied by one of her daughters who assists with interpretation, the family speaks Arabic.  Per the daughter the patient was in her usual state of health through yesterday as she stays with her and then in the evening she got over to her other daughter's home.  While she was seated she suddenly began to have severe shaking and became lightheaded as though she was going to pass out.  Apparently just prior to that she was having difficulty verbalizing, she was initially confused but just looked so tired that she could not talk.  She had reported dysuria and flank pain, stating that when she has to urinate she feels pain from her back radiating down.  She denies any cough, shortness of breath or other sick contacts.  She denies any new rashes but she has a lesion to her right lower extremity that is chronically irritated.  It appears they have put some topical debridement agent that they report had addressed it.  They have not previously followed with dermatology for this.  She has not had any diarrhea.  She is not been on any recent antibiotics.  She had not eaten anything undercooked or abnormal.  She has no sinus pain.  She has no abdominal pain when eating.  She has never previously been hospitalized for an infection.  Per the daughter when she was in the emergency room she just had trouble speaking she was not necessarily ever confused.  Patient is  oriented presently and states she was having trouble speaking.  No neck pain no headaches that are new she has occasional headaches chronically.  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  No history of smoking, alcohol use or drug use  She does not follow closely with a physician here but has been prescribed medications which the daughter will plan to bring in to assist with potentially related to prior medical conditions  She is otherwise independent and ambulatory without DME  Family is originally from [place]   Review of Systems: As per HPI otherwise 10 point review of systems negative.    Past Medical History:  Diagnosis Date   Coronary artery disease    Hypertension     Past Surgical History:  Procedure Laterality Date   CARDIAC CATHETERIZATION       reports that she has never smoked. She has never used smokeless tobacco. She reports that she does not drink alcohol or use drugs.  No Known Allergies  History reviewed. No pertinent family history.   Prior to Admission medications   Not on File    Physical Exam: Vitals:   07/13/19 0815 07/13/19 0939 07/13/19 1000 07/13/19 1100  BP: 104/64 (!) 123/58 119/60 (!) 125/57  Pulse: 70 69 66 69  Resp: 16 18 17 17   Temp:  97.9 F (36.6 C)    TempSrc:  Oral    SpO2: 100% 99% 100% 100%  Weight:  94.9 kg  Height:   (1.575 m)      Constitutional: NAD, calm, comfortable Vitals:   07/13/19 0815 07/13/19 0939 07/13/19 1000 07/13/19 1100  BP: 104/64 (!) 123/58 119/60 (!) 125/57  Pulse: 70 69 66 69  Resp: Temp:  97.9 F (36.6 C)    TempSrc:  Oral    SpO2: 100% 99% 100% 100%  Weight:  94.9 kg    Height:   (1.575 m)     General: appears comfortable, conversing appropriately and fluently with daughter, AAOx3 Eyes: EOMI, PERRL, lids and conjunctivae normal ENMT: Mucous membranes are moist. Posterior pharynx clear of any exudate or lesions.Normal  dentition.  Neck: normal, supple, no masses, no thyromegaly Respiratory: clear to auscultation bilaterally, no wheezing, no crackles. Normal respiratory effort. No accessory muscle use.  Cardiovascular: Regular rate and rhythm, no murmurs / rubs / gallops. No extremity edema. 2+ pedal pulses. No carotid bruits.  Abdomen: + LLQ tenderness however mild, no rebound or guarding. Musculoskeletal: no clubbing / cyanosis. No joint deformity upper and lower extremities. Good ROM, no contractures. Normal muscle tone.  Skin: healing skin lesion over lateral aspect of RLE, there is some mild erythema at the border, but not warm or tender, nor is there any fluctuance Neurologic: CN 2-12 grossly intact. Strength and sensation is grossly intact Psychiatric: Normal judgment and insight. Alert and oriented x 3. Normal mood.     Labs on Admission: I have personally reviewed following labs and imaging studies  CBC: Recent Labs  Lab 07/12/19 2201 07/13/19 1019  WBC 12.6* 28.5*  NEUTROABS 8.5* 24.9*  HGB 14.5 13.4  HCT 45.1 42.9  MCV 88.3 92.1  PLT 310 233   Basic Metabolic Panel: Recent Labs  Lab 07/12/19 2201 07/13/19 1019  NA 138 140  K 3.6 4.3  CL 103 107  CO2 26 24  GLUCOSE 210* 130*  BUN 18 14  CREATININE 0.86 0.74  CALCIUM 9.2 8.7*   GFR: Estimated Creatinine Clearance: 71.2 mL/min (by C-G formula based on SCr of 0.74 mg/dL). Liver Function Tests: Recent Labs  Lab 07/12/19 2201 07/13/19 1019  AST 31 26  ALT 37 30  ALKPHOS 157* 110  BILITOT 0.3 0.6  PROT 8.3* 6.9  ALBUMIN 4.5 3.6   No results for input(s): LIPASE, AMYLASE in the last 168 hours. No results for input(s): AMMONIA in the last 168 hours. Coagulation Profile: Recent Labs  Lab 07/13/19 1019  INR 1.2   Cardiac Enzymes: No results for input(s): CKTOTAL, CKMB, CKMBINDEX, TROPONINI in the last 168 hours. BNP (last 3 results) No results for input(s): PROBNP in the last 8760 hours. HbA1C: No results for  input(s): HGBA1C in the last 72 hours. CBG: No results for input(s): GLUCAP in the last 168 hours. Lipid Profile: No results for input(s): CHOL, HDL, LDLCALC, TRIG, CHOLHDL, LDLDIRECT in the last 72 hours. Thyroid Function Tests: No results for input(s): TSH, T4TOTAL, FREET4, T3FREE, THYROIDAB in the last 72 hours. Anemia Panel: No results for input(s): VITAMINB12, FOLATE, FERRITIN, TIBC, IRON, RETICCTPCT in the last 72 hours. Urine analysis:    Component Value Date/Time   COLORURINE YELLOW 07/12/2019 2201   APPEARANCEUR CLEAR 07/12/2019 2201   LABSPEC 1.025 07/12/2019 2201   PHURINE 6.5 07/12/2019 2201   GLUCOSEU NEGATIVE 07/12/2019 2201   HGBUR NEGATIVE 07/12/2019 2201   BILIRUBINUR NEGATIVE 07/12/2019 2201   KETONESUR 15 (A) 07/12/2019 2201   PROTEINUR NEGATIVE 07/12/2019 2201   NITRITE NEGATIVE 07/12/2019 2201  LEUKOCYTESUR NEGATIVE 07/12/2019 2201    Radiological Exams on Admission: Ct Abdomen Pelvis W Contrast  Result Date: 07/13/2019 CLINICAL DATA:  68 year old female with bilateral flank pain, dysuria and suprapubic pain for 2 days with fever and chills. EXAM: CT ABDOMEN AND PELVIS WITH CONTRAST TECHNIQUE: Multidetector CT imaging of the abdomen and pelvis was performed using the standard protocol following bolus administration of intravenous contrast. CONTRAST:  100mL OMNIPAQUE IOHEXOL 300 MG/ML  SOLN COMPARISON:  Noncontrast CT Abdomen and Pelvis earlier tonight. FINDINGS: Lower chest: Mild cardiomegaly. Similar low lung volumes with mild atelectasis. No pericardial or pleural effusion. Small gastric hiatal hernia. Hepatobiliary: Evidence of hepatic steatosis. Otherwise negative liver and gallbladder. No bile duct enlargement. Pancreas: Negative. Spleen: Negative. Adrenals/Urinary Tract: Normal adrenal glands. Bilateral renal enhancement and contrast excretion is symmetric and within normal limits. Small left lower pole benign parapelvic cysts. Negative course of both ureters.  No perinephric or periureteral stranding. Smaller bladder volume now, possible mild perivesical stranding as seen on sagittal image 74, but otherwise unremarkable bladder. Stomach/Bowel: Mild retained stool in the rectum. Redundant but largely decompressed sigmoid colon. Mild retained stool in the descending and transverse colon. Redundant hepatic flexure. No large bowel inflammation. Diminutive or absent appendix. Negative terminal ileum. No dilated small bowel. Negative stomach aside from small hiatal hernia. Negative duodenum. No free air, free fluid. Hazy opacity at the root of the small bowel mesentery is less apparent, and significance is doubtful. Vascular/Lymphatic: Mild Aortoiliac calcified atherosclerosis. Major arterial structures are patent. Portal venous system is patent. No lymphadenopathy. Reproductive: Negative. Other: No pelvic free fluid. Musculoskeletal: Lumbar facet degeneration. No acute osseous abnormality identified. IMPRESSION: 1. UTI is not excluded - with questionable mild perivesical inflammatory stranding. But there is no evidence of ascending urinary infection or pyelonephritis. 2. No other acute or inflammatory process identified in the abdomen or pelvis. 3. Mild cardiomegaly. Small hiatal hernia. Lung base atelectasis. Hepatic steatosis. Electronically Signed   By: Odessa FlemingH  Hall M.D.   On: 07/13/2019 03:30   Dg Chest Portable 1 View  Result Date: 07/12/2019 CLINICAL DATA:  Fever, chills EXAM: PORTABLE CHEST 1 VIEW COMPARISON:  None. FINDINGS: Cardiomegaly. No confluent opacities, effusions or edema. No acute bony abnormality. IMPRESSION: Cardiomegaly.  No active disease. Electronically Signed   By: Charlett NoseKevin  Dover M.D.   On: 07/12/2019 23:49   Ct Renal Stone Study  Result Date: 07/12/2019 CLINICAL DATA:  Bilateral flank pain, fever, chills, dysuria. EXAM: CT ABDOMEN AND PELVIS WITHOUT CONTRAST TECHNIQUE: Multidetector CT imaging of the abdomen and pelvis was performed following the  standard protocol without IV contrast. COMPARISON:  None. FINDINGS: Lower chest: Lung bases are clear. No effusions. Heart is normal size. Hepatobiliary: Diffuse low-density throughout the liver compatible with fatty infiltration. No focal abnormality. Gallbladder unremarkable. Pancreas: No focal abnormality or ductal dilatation. Spleen: No focal abnormality.  Normal size. Adrenals/Urinary Tract: No adrenal abnormality. No focal renal abnormality. No stones or hydronephrosis. Urinary bladder is unremarkable. Stomach/Bowel: Stomach, large and small bowel grossly unremarkable. Appendix not seen. No inflammatory process in the right lower quadrant. Vascular/Lymphatic: Aortic atherosclerosis. No enlarged abdominal or pelvic lymph nodes. There is haziness within the mesentery with few mildly prominent mesenteric lymph nodes. Reproductive: Uterus and adnexa unremarkable.  No mass. Other: No free fluid or free air. Musculoskeletal: No acute bony abnormality. IMPRESSION: Fatty infiltration of the liver. No renal or ureteral stones. Haziness in the mesentery with mildly prominent mesenteric lymph nodes. 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 Aortic atherosclerosis. Electronically Signed  By: Rolm Baptise M.D.   On: 07/12/2019 23:11    EKG: Independently reviewed.   Assessment/Plan Christina Dalton is a 68 y.o. female with unclear past medical history but potentially remote PE greater than 30 years ago requiring thrombolysis, either prediabetes or glycogen storage disorder, hypertension who presents with fevers and rigors concerning for sepsis and possible bacteremia but of unclear etiology.  # Sepsis possibly from urinary source # Acute toxic metabolic encephalopathy - patient reports dysuria and flank pain, but presently this is resolved and she does not report any additional localizing symptoms - no cough, abdominal pain, no diarrhea, no sinus pain, no skin changes (nothing new).  She has not been on  antibiotics recently.  Her UA is surprisingly unremarkable but CT does show ?able mild perivesical inflammatory stranding but no pyelonephritis. - She had been febrile, initial lab work revealed mild leukocytosis of 12.6K subsequently now 28.5K and she has not received steroids.  Due to unclear source and AMS meningitis was on the differential but patient never with meningismus and she is AAOx3 at present. Her most recent lactate is 2.2 and she has stable vitals - follow-up blood cultures, urine cx, added on procalcitonin, do suspect this is bacterial in etiology - COVID 19 negative - continue empiric Rx with Vanc and Ceftriaxone  # Hepatic steatosis # Obesity - BMI 38.27 - weight loss, affects all aspects of care  # RLE wound - Reviewing notes in chart, patient is from [place] and they thought this may have been cutaneous Leishmaniasis but biopsy in the past was negative - she was on clindamycin for a course after d/w Infectious Disease (2018) - they have not seen dermatology in follow-up, recommend  #Abnormal imaging - 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

## 2019-07-13 NOTE — Progress Notes (Signed)
Upon evening assessment, patient's right lower extremity is red, painful to touch, and warm. Daughter at bedside and reports that is where patient had MRSA years ago. Ordered IV antibiotics given by this RN while in room. Will continue to monitor. Carnella Guadal[place] I

## 2019-07-14 LAB — CBC
HCT: 36.8 % (ref 36.0–46.0)
Hemoglobin: 11.8 g/dL — ABNORMAL LOW (ref 12.0–15.0)
MCH: 29.2 pg (ref 26.0–34.0)
MCHC: 32.1 g/dL (ref 30.0–36.0)
MCV: 91.1 fL (ref 80.0–100.0)
Platelets: 195 10*3/uL (ref 150–400)
RBC: 4.04 MIL/uL (ref 3.87–5.11)
RDW: 14.2 % (ref 11.5–15.5)
WBC: 16.4 10*3/uL — ABNORMAL HIGH (ref 4.0–10.5)
nRBC: 0 % (ref 0.0–0.2)

## 2019-07-14 LAB — HIV ANTIBODY (ROUTINE TESTING W REFLEX): HIV Screen 4th Generation wRfx: NONREACTIVE — AB

## 2019-07-14 LAB — GLUCOSE, CAPILLARY: Glucose-Capillary: 114 mg/dL — ABNORMAL HIGH (ref 70–99)

## 2019-07-14 LAB — BASIC METABOLIC PANEL
Anion gap: 7 (ref 5–15)
BUN: 9 mg/dL (ref 8–23)
CO2: 22 mmol/L (ref 22–32)
Calcium: 8.4 mg/dL — ABNORMAL LOW (ref 8.9–10.3)
Chloride: 110 mmol/L (ref 98–111)
Creatinine, Ser: 0.57 mg/dL (ref 0.44–1.00)
GFR calc Af Amer: >60 mL/min (ref >60)
GFR calc non Af Amer: >60 mL/min (ref >60)
Glucose, Bld: 118 mg/dL — ABNORMAL HIGH (ref 70–99)
Potassium: 3.7 mmol/L (ref 3.5–5.1)
Sodium: 139 mmol/L (ref 135–145)

## 2019-07-14 LAB — PROCALCITONIN: Procalcitonin: 3.8 ng/mL

## 2019-07-14 NOTE — Progress Notes (Signed)
PROGRESS NOTE    Ricka BurdockFatima Leatherbury  ZOX:[id number] DOB: 12/18/1950 DOA: 07/12/2019 PCP: Patient, No Pcp Per   Brief Narrative:  Patient is a 68-year-old female with PE, hypertension who presented with fevers and chills from home.  Patient was suspected to have sepsis of unknown origin.  Started on bolus of antibiotics.  Overall condition looking better.  Plan for  discharge tomorrow if remains hemodynamically stable.  Assessment & Plan:   Active Problems:   Fever   Sepsis (HCC)   Suspected sepsis: Suspected from urinary source.  Reported dysuria and flank pain but the symptoms have resolved.  Urine culture showed multiple species.  Urinalysis not impressive.  Patient denies dysuria.  CT abdomen/pelvis showed perivesicular inflammatory stranding but no pyonephritis.  She presented with leukocytosis which has been improving.  Elevated procalcitonin,lactic acid.  Blood cultures no growth so far.  Continue broad-spectrum antibiotics. We will discontinue IV fluids.  Altered mental status: Resolved.  Currently at baseline.  AMS could be from acute toxic metabolic encephalopathy from sepsis on presentation.  Hypertension: Currently blood pressure stable.  Not on medicines at home.  Hepatic steatosis: Monitor as an outpatient.Normal liver enzymes  Obesity: BMI of 38.  Right lower extremity wound: Chronic .Patient is from [place].  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  She was treated with clindamycin in 2018.         DVT prophylaxis:SCD Code Status: Full Family Communication: Granddaughter at the bedside Disposition Plan: Home tomorrow   Consultants: None  Procedures: None  Antimicrobials:  Anti-infectives (From admission, onward)   Start     Dose/Rate Route Frequency Ordered Stop   07/13/19 2200  cefTRIAXone (ROCEPHIN) 1 g in sodium chloride 0.9 % 100 mL IVPB     1 g 200 mL/hr over 30 Minutes Intravenous Every 24 hours 07/13/19 0939     07/13/19 2000  vancomycin (VANCOCIN) IVPB 750 mg/150 ml premix     750 mg 150 mL/hr over 60 Minutes Intravenous Every 24 hours 07/13/19 1446     07/13/19 0215  vancomycin (VANCOCIN) IVPB 1000 mg/200 mL premix     1,000 mg 200 mL/hr over 60 Minutes Intravenous  Once 07/13/19 0200 07/13/19 0325   07/12/19 2200  cefTRIAXone (ROCEPHIN) 1 g in sodium chloride 0.9 % 100 mL IVPB     1 g 200 mL/hr over 30 Minutes Intravenous  Once 07/12/19 2157 07/12/19 2301      Subjective: Patient seen and examined at bedside this afternoon.  Hemodynamically stable.  Comfortable.  Denies any new complaints.  Feels better.  Objective: Vitals:   07/13/19 1814 07/13/19 2045 07/14/19 0322 07/14/19 0558  BP: 130/60 139/76 (!) 159/76 133/70  Pulse: 84 79 88 72  Resp: 18 20 18 18   Temp: 100.3 F (37.9 C) 98.7 F (37.1 C) 98.7 F (37.1 C) 98.5 F (36.9 C)  TempSrc: Oral   Oral  SpO2: 98% 100% 96% 92%  Weight:      Height:        Intake/Output Summary (Last 24 hours) at 07/14/2019 1418 Last data filed at 07/14/2019 1413 Gross per 24 hour  Intake 3518.41 ml  Output 950 ml  Net 2568.41 ml   Filed Weights   07/12/19 2129 07/13/19 0939  Weight: 81.6 kg 94.9 kg    Examination:  General exam: Appears calm and comfortable ,Not in distress,obese HEENT:PERRL,Oral mucosa moist, Ear/Nose normal on gross exam Respiratory system: Bilateral equal air entry, normal vesicular breath sounds, no wheezes or  crackles  Cardiovascular system: S1 & S2 heard, RRR. No JVD, murmurs, rubs, gallops or clicks. No pedal edema. Gastrointestinal system: Abdomen is nondistended, soft and nontender. No organomegaly or masses felt. Normal bowel sounds heard. Central nervous system: Alert and oriented. No focal neurological deficits. Extremities: No edema, no clubbing ,no cyanosis, distal peripheral pulses palpable. Skin: Healed and dry wound with a patch  on the lateral right lower extremity    Data Reviewed: I have  personally reviewed following labs and imaging studies  CBC: Recent Labs  Lab 07/12/19 2201 07/13/19 1019 07/14/19 0529  WBC 12.6* 28.5* 16.4*  NEUTROABS 8.5* 24.9*  --   HGB 14.5 13.4 11.8*  HCT 45.1 42.9 36.8  MCV 88.3 92.1 91.1  PLT 310 233 195   Basic Metabolic Panel: Recent Labs  Lab 07/12/19 2201 07/13/19 1019 07/14/19 0529  NA 138 140 139  K 3.6 4.3 3.7  CL 103 107 110  CO2 GLUCOSE 210* 130* 118*  BUN CREATININE 0.86 0.74 0.57  CALCIUM 9.2 8.7* 8.4*   GFR: Estimated Creatinine Clearance: 71.2 mL/min (by C-G formula based on SCr of 0.57 mg/dL). Liver Function Tests: Recent Labs  Lab 07/12/19 2201 07/13/19 1019  AST 31 26  ALT 37 30  ALKPHOS 157* 110  BILITOT 0.3 0.6  PROT 8.3* 6.9  ALBUMIN 4.5 3.6   No results for input(s): LIPASE, AMYLASE in the last 168 hours. No results for input(s): AMMONIA in the last 168 hours. Coagulation Profile: Recent Labs  Lab 07/13/19 1019  INR 1.2   Cardiac Enzymes: No results for input(s): CKTOTAL, CKMB, CKMBINDEX, TROPONINI in the last 168 hours. BNP (last 3 results) No results for input(s): PROBNP in the last 8760 hours. HbA1C: No results for input(s): HGBA1C in the last 72 hours. CBG: Recent Labs  Lab 07/14/19 1020  GLUCAP 114*   Lipid Profile: No results for input(s): CHOL, HDL, LDLCALC, TRIG, CHOLHDL, LDLDIRECT in the last 72 hours. Thyroid Function Tests: No results for input(s): TSH, T4TOTAL, FREET4, T3FREE, THYROIDAB in the last 72 hours. Anemia Panel: No results for input(s): VITAMINB12, FOLATE, FERRITIN, TIBC, IRON, RETICCTPCT in the last 72 hours. Sepsis Labs: Recent Labs  Lab 07/12/19 2201 07/12/19 2345 07/13/19 1019 07/13/19 1243 07/13/19 1300 07/14/19 0529  PROCALCITON  --   --   --   --  5.09 3.80  LATICACIDVEN 2.6* 2.6* 2.9* 2.2*  --   --     Recent Results (from the past 240 hour(s))  Culture, blood (routine x 2)     Status: None (Preliminary result)    Collection Time: 07/12/19  9:40 PM   Specimen: BLOOD RIGHT ARM  Result Value Ref Range Status   Specimen Description   Final    BLOOD RIGHT ARM Performed at [facility], [address] [place], [place] [address]    Special Requests   Final    BOTTLES DRAWN AEROBIC AND ANAEROBIC Blood Culture adequate volume Performed at [facility], [address], [place], [place] [address]    Culture   Final    NO GROWTH 1 DAY Performed at [facility], [address] [place], [place] [address]    Report Status PENDING  Incomplete  Urine culture     Status: Abnormal   Collection Time: 07/12/19 10:01 PM   Specimen: Urine, Random  Result Value Ref Range Status   Specimen Description   Final  URINE, RANDOM Performed at [facility], [address] [place], [place] [address]    Special Requests   Final    NONE Performed at [facility], [address] [place], [place] [address]    Culture MULTIPLE SPECIES PRESENT, SUGGEST RECOLLECTION (A)  Final   Report Status 07/13/2019 FINAL  Final  Culture, blood (routine x 2)     Status: None (Preliminary result)   Collection Time: 07/12/19 10:37 PM   Specimen: BLOOD LEFT HAND  Result Value Ref Range Status   Specimen Description   Final    BLOOD LEFT HAND Performed at [facility], [address] [place], [place] [address]    Special Requests   Final    BOTTLES DRAWN AEROBIC ONLY Blood Culture results may not be optimal due to an inadequate volume of blood received in culture bottles Performed at [facility], [address] [place], [place] [address]    Culture   Final    NO GROWTH 1 DAY Performed at [facility], [address], [place], [place] [address]    Report Status PENDING  Incomplete  SARS CORONAVIRUS 2 (TAT 6-24 HRS) Nasopharyngeal Nasopharyngeal Swab     Status: None   Collection Time: 07/12/19 11:25 PM   Specimen: Nasopharyngeal Swab  Result Value  Ref Range Status   SARS Coronavirus 2 NEGATIVE NEGATIVE Final    Comment: (NOTE) SARS-CoV-2 target nucleic acids are NOT DETECTED. The SARS-CoV-2 RNA is generally detectable in upper and lower respiratory specimens during the acute phase of infection. Negative results do not preclude SARS-CoV-2 infection, do not rule out co-infections with other pathogens, and should not be used as the sole basis for treatment or other patient management decisions. Negative results must be combined with clinical observations, patient history, and epidemiological information. The expected result is Negative. Fact Sheet for Patients: HairSlick.no Fact Sheet for Healthcare Providers: quierodirigir.com This test is not yet approved or cleared by the [place] FDA and  has been authorized for detection and/or diagnosis of SARS-CoV-2 by FDA under an Emergency Use Authorization (EUA). This EUA will remain  in effect (meaning this test can be used) for the duration of the COVID-19 declaration under Section 56 4(b)(1) of the Act, 21 U.S.C. section 360bbb-3(b)(1), unless the authorization is terminated or revoked sooner. Performed at [facility], [address], [place], [place] [address]   MRSA PCR Screening     Status: None   Collection Time: 07/13/19  9:44 AM   Specimen: Nasal Mucosa; Nasopharyngeal  Result Value Ref Range Status   MRSA by PCR NEGATIVE NEGATIVE Final    Comment:        The GeneXpert MRSA Assay (FDA approved for NASAL specimens only), is one component of a comprehensive MRSA colonization surveillance program. It is not intended to diagnose MRSA infection nor to guide or monitor treatment for MRSA infections. Performed at [facility], [address], [place], [place] [address]          Radiology Studies: Ct Abdomen Pelvis W Contrast  Result Date: 07/13/2019 CLINICAL DATA:  68 year old  female with bilateral flank pain, dysuria and suprapubic pain for 2 days with fever and chills. EXAM: CT ABDOMEN AND PELVIS WITH CONTRAST TECHNIQUE: Multidetector CT imaging of the abdomen and pelvis was performed using the standard protocol following bolus administration of intravenous contrast. CONTRAST:  OMNIPAQUE IOHEXOL 300 MG/ML  SOLN COMPARISON:  Noncontrast CT Abdomen and Pelvis earlier tonight. FINDINGS: Lower chest: Mild cardiomegaly. Similar low lung volumes with mild atelectasis. No pericardial or pleural effusion. Small gastric hiatal hernia. Hepatobiliary: Evidence of hepatic steatosis. Otherwise negative liver and gallbladder. No bile duct enlargement. Pancreas: Negative. Spleen: Negative. Adrenals/Urinary Tract: Normal adrenal glands. Bilateral renal enhancement and contrast excretion is symmetric and within normal limits. Small left lower pole benign parapelvic cysts. Negative course of both ureters. No perinephric or periureteral stranding. Smaller bladder volume now, possible mild perivesical stranding as seen on sagittal image 74, but otherwise unremarkable bladder. Stomach/Bowel: Mild retained stool in the rectum. Redundant but largely decompressed sigmoid colon. Mild retained stool in the descending and transverse colon. Redundant hepatic flexure. No large bowel inflammation. Diminutive or absent appendix. Negative terminal ileum. No dilated small bowel. Negative stomach aside from small hiatal hernia. Negative duodenum. No free air, free fluid. Hazy opacity at the root of the small bowel mesentery is less apparent, and significance is doubtful. Vascular/Lymphatic: Mild Aortoiliac calcified atherosclerosis. Major arterial structures are patent. Portal venous system is patent. No lymphadenopathy. Reproductive: Negative. Other: No pelvic free fluid. Musculoskeletal: Lumbar facet degeneration. No acute osseous abnormality identified. IMPRESSION: 1. UTI is not excluded - with questionable  mild perivesical inflammatory stranding. But there is no evidence of ascending urinary infection or pyelonephritis. 2. No other acute or inflammatory process identified in the abdomen or pelvis. 3. Mild cardiomegaly. Small hiatal hernia. Lung base atelectasis. Hepatic steatosis. Electronically Signed   By: Odessa Fleming M.D.   On: 07/13/2019 03:30   Dg Chest Portable 1 View  Result Date: 07/12/2019 CLINICAL DATA:  Fever, chills EXAM: PORTABLE CHEST 1 VIEW COMPARISON:  None. FINDINGS: Cardiomegaly. No confluent opacities, effusions or edema. No acute bony abnormality. IMPRESSION: Cardiomegaly.  No active disease. Electronically Signed   By: Charlett Nose M.D.   On: 07/12/2019 23:49   Ct Renal Stone Study  Result Date: 07/12/2019 CLINICAL DATA:  Bilateral flank pain, fever, chills, dysuria. EXAM: CT ABDOMEN AND PELVIS WITHOUT CONTRAST TECHNIQUE: Multidetector CT imaging of the abdomen and pelvis was performed following the standard protocol without IV contrast. COMPARISON:  None. FINDINGS: Lower chest: Lung bases are clear. No effusions. Heart is normal size. Hepatobiliary: Diffuse low-density throughout the liver compatible with fatty infiltration. No focal abnormality. Gallbladder unremarkable. Pancreas: No focal abnormality or ductal dilatation. Spleen: No focal abnormality.  Normal size. Adrenals/Urinary Tract: No adrenal abnormality. No focal renal abnormality. No stones or hydronephrosis. Urinary bladder is unremarkable. Stomach/Bowel: Stomach, large and small bowel grossly unremarkable. Appendix not seen. No inflammatory process in the right lower quadrant. Vascular/Lymphatic: Aortic atherosclerosis. No enlarged abdominal or pelvic lymph nodes. There is haziness within the mesentery with few mildly prominent mesenteric lymph nodes. Reproductive: Uterus and adnexa unremarkable.  No mass. Other: No free fluid or free air. Musculoskeletal: No acute bony abnormality. IMPRESSION: Fatty infiltration of the liver.  No renal or ureteral stones. Haziness in the mesentery with mildly prominent mesenteric lymph nodes. 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 Aortic atherosclerosis. Electronically Signed   By: Charlett Nose M.D.   On: 07/12/2019 23:11        Scheduled Meds:  mouth rinse  15 mL Mouth Rinse BID   Continuous Infusions:  cefTRIAXone (ROCEPHIN)  IV 1 g (07/13/19 2246)   vancomycin 750 mg (07/13/19 2030)     LOS: 1 day    Time spent: 25 mins.More than 50% of that time was spent in counseling and/or coordination of care.  Shelly Coss, MD [facility] Pager [phone]  If 7PM-7AM, please contact night-coverage [email or web address] Password [email or web address] 07/14/2019, 2:18 PM

## 2019-07-14 NOTE — Plan of Care (Signed)
  Problem: Activity: Goal: Risk for activity intolerance will decrease Outcome: Progressing   Problem: Nutrition: Goal: Adequate nutrition will be maintained Outcome: Completed/Met   Problem: Coping: Goal: Level of anxiety will decrease Outcome: Completed/Met

## 2019-07-15 LAB — CBC WITH DIFFERENTIAL/PLATELET
Abs Immature Granulocytes: 0.03 10*3/uL (ref 0.00–0.07)
Basophils Absolute: 0 10*3/uL (ref 0.0–0.1)
Basophils Relative: 0 %
Eosinophils Absolute: 0.3 10*3/uL (ref 0.0–0.5)
Eosinophils Relative: 3 %
HCT: 38.9 % (ref 36.0–46.0)
Hemoglobin: 12.5 g/dL (ref 12.0–15.0)
Immature Granulocytes: 0 %
Lymphocytes Relative: 20 %
Lymphs Abs: 2.2 10*3/uL (ref 0.7–4.0)
MCH: 29 pg (ref 26.0–34.0)
MCHC: 32.1 g/dL (ref 30.0–36.0)
MCV: 90.3 fL (ref 80.0–100.0)
Monocytes Absolute: 1 10*3/uL (ref 0.1–1.0)
Monocytes Relative: 9 %
Neutro Abs: 7.7 10*3/uL (ref 1.7–7.7)
Neutrophils Relative %: 68 %
Platelets: 214 10*3/uL (ref 150–400)
RBC: 4.31 MIL/uL (ref 3.87–5.11)
RDW: 13.9 % (ref 11.5–15.5)
WBC: 11.3 10*3/uL — ABNORMAL HIGH (ref 4.0–10.5)
nRBC: 0 % (ref 0.0–0.2)

## 2019-07-15 LAB — BASIC METABOLIC PANEL
Anion gap: 9 (ref 5–15)
BUN: 6 mg/dL — ABNORMAL LOW (ref 8–23)
CO2: 24 mmol/L (ref 22–32)
Calcium: 8.8 mg/dL — ABNORMAL LOW (ref 8.9–10.3)
Chloride: 107 mmol/L (ref 98–111)
Creatinine, Ser: 0.5 mg/dL (ref 0.44–1.00)
GFR calc Af Amer: >60 mL/min (ref >60)
GFR calc non Af Amer: >60 mL/min (ref >60)
Glucose, Bld: 116 mg/dL — ABNORMAL HIGH (ref 70–99)
Potassium: 3.7 mmol/L (ref 3.5–5.1)
Sodium: 140 mmol/L (ref 135–145)

## 2019-07-15 LAB — GLUCOSE, CAPILLARY: Glucose-Capillary: 112 mg/dL — ABNORMAL HIGH (ref 70–99)

## 2019-07-15 LAB — LACTIC ACID, PLASMA: Lactic Acid, Venous: 0.9 mmol/L (ref 0.5–1.9)

## 2019-07-15 MED ORDER — AMLODIPINE BESYLATE 5 MG PO TABS: 5.0000 mg | ORAL_TABLET | Freq: Every day | ORAL | 0 refills | Status: DC

## 2019-07-15 MED ORDER — AMOXICILLIN-POT CLAVULANATE 875-125 MG PO TABS: 1.0000 | ORAL_TABLET | Freq: Two times a day (BID) | ORAL | 0 refills | Status: AC

## 2019-07-15 MED ORDER — AMLODIPINE BESYLATE 5 MG PO TABS
5.0000 mg | ORAL_TABLET | Freq: Every day | ORAL | Status: DC
  Administered 2019-07-15: 5 mg via ORAL
  Filled 2019-07-15: qty 1

## 2019-07-15 NOTE — Discharge Instructions (Signed)
Amoxicillin; Clavulanic Acid tablets °What is this medicine? °AMOXICILLIN; CLAVULANIC ACID (a mox i SIL in; KLAV yoo lan ic AS id) is a penicillin antibiotic. It is used to treat certain kinds of bacterial infections. It will not work for colds, flu, or other viral infections. °This medicine may be used for other purposes; ask your health care provider or pharmacist if you have questions. °COMMON BRAND NAME(S): Augmentin °What should I tell my health care provider before I take this medicine? °They need to know if you have any of these conditions: °· bowel disease, like colitis °· kidney disease °· liver disease °· mononucleosis °· an unusual or allergic reaction to amoxicillin, penicillin, cephalosporin, other antibiotics, clavulanic acid, other medicines, foods, dyes, or preservatives °· pregnant or trying to get pregnant °· breast-feeding °How should I use this medicine? °Take this medicine by mouth with a full glass of water. Follow the directions on the prescription label. Take at the start of a meal. Do not crush or chew. If the tablet has a score line, you may cut it in half at the score line for easier swallowing. Take your medicine at regular intervals. Do not take your medicine more often than directed. Take all of your medicine as directed even if you think you are better. Do not skip doses or stop your medicine early. °Talk to your pediatrician regarding the use of this medicine in children. Special care may be needed. °Overdosage: If you think you have taken too much of this medicine contact a poison control center or emergency room at once. °NOTE: This medicine is only for you. Do not share this medicine with others. °What if I miss a dose? °If you miss a dose, take it as soon as you can. If it is almost time for your next dose, take only that dose. Do not take double or extra doses. °What may interact with this medicine? °· allopurinol °· anticoagulants °· birth control  pills °· methotrexate °· probenecid °This list may not describe all possible interactions. Give your health care provider a list of all the medicines, herbs, non-prescription drugs, or dietary supplements you use. Also tell them if you smoke, drink alcohol, or use illegal drugs. Some items may interact with your medicine. °What should I watch for while using this medicine? °Tell your doctor or healthcare provider if your symptoms do not improve. °This medicine may cause serious skin reactions. They can happen weeks to months after starting the medicine. Contact your healthcare provider right away if you notice fevers or flu-like symptoms with a rash. The rash may be red or purple and then turn into blisters or peeling of the skin. Or, you might notice a red rash with swelling of the face, lips or lymph nodes in your neck or under your arms. °Do not treat diarrhea with over the counter products. Contact your doctor if you have diarrhea that lasts more than 2 days or if it is severe and watery. °If you have diabetes, you may get a false-positive result for sugar in your urine. Check with your doctor or healthcare provider. °Birth control pills may not work properly while you are taking this medicine. Talk to your doctor about using an extra method of birth control. °What side effects may I notice from receiving this medicine? °Side effects that you should report to your doctor or health care professional as soon as possible: °· allergic reactions like skin rash, itching or hives, swelling of the face, lips, or tongue °·   breathing problems °· dark urine °· fever or chills, sore throat °· redness, blistering, peeling, or loosening of the skin, including inside the mouth °· seizures °· trouble passing urine or change in the amount of urine °· unusual bleeding, bruising °· unusually weak or tired °· white patches or sores in the mouth or throat °Side effects that usually do not require medical attention (report to your  doctor or health care professional if they continue or are bothersome): °· diarrhea °· dizziness °· headache °· nausea, vomiting °· stomach upset °· vaginal or anal irritation °This list may not describe all possible side effects. Call your doctor for medical advice about side effects. You may report side effects to FDA at 1-800-FDA-1088. °Where should I keep my medicine? °Keep out of the reach of children. °Store at room temperature below 25 degrees C (77 degrees F). Keep container tightly closed. Throw away any unused medicine after the expiration date. °NOTE: This sheet is a summary. It may not cover all possible information. If you have questions about this medicine, talk to your doctor, pharmacist, or health care provider. °© 2020 [employer] (2018-10-27 09:43:46) ° °

## 2019-07-15 NOTE — Discharge Summary (Signed)
Physician Discharge Summary  Ricka BurdockFatima Collett ZOX:[id number] DOB: 09/19/1950 DOA: 07/12/2019  PCP: Patient, No Pcp Per  Admit date: 07/12/2019 Discharge date: 07/15/2019  Admitted From: Home Disposition:  Home  Discharge Condition:Stable CODE STATUS:FULL Diet recommendation: Heart Healthy  Brief/Interim Summary:  Patient is a 68 year old female with PE, hypertension who presented with fevers and chills from home.  Patient was suspected to have sepsis of unknown origin.  Started on broad spectrum antibiotics.    Procalcitonin was elevated.  Severe leukocytosis with lactic acidosis.  Source of infection remains unknown.  Blood cultures negative so far.  Urine culture showed mixed organisms.  Patient has remained hemodynamically stable.  Antibiotics changed to oral.  She is stable for discharge to home today.  Following problems were addressed during her hospitalization:  Suspected sepsis: Suspected from urinary source.  Reported dysuria and flank pain but the symptoms have resolved.  Urine culture showed multiple species.  Urinalysis not impressive.  Patient denies dysuria.  CT abdomen/pelvis showed perivesicular inflammatory stranding but no pyonephritis.  She presented with leukocytosis which has resolved.  Elevated procalcitonin,lactic acid on presentation.  Blood cultures no growth so far.  Abx changed to oral.  Altered mental status: Resolved.  Currently at baseline.  AMS could be from acute toxic metabolic encephalopathy from possible sepsis on presentation.  Hypertension: Mildly hypertensive today.  Started on amlodipine.  Hepatic steatosis: Monitor as an outpatient.Normal liver enzymes  Obesity: BMI of 38.  Right lower extremity wound: Chronic .Patient is from [place].  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  She was treated with clindamycin in 2018.    Discharge Diagnoses:  Active Problems:   Fever   Sepsis [facility](HCC)    Discharge  Instructions  Discharge Instructions    Diet - low sodium heart healthy   Complete by: As directed    Discharge instructions   Complete by: As directed    1)Take prescribed medications as instructed. 2)Follow up with a PCP in a week.   Increase activity slowly   Complete by: As directed      Allergies as of 07/15/2019   No Known Allergies     Medication List    STOP taking these medications   ibuprofen 200 MG tablet Commonly known as: ADVIL     TAKE these medications   amLODipine 5 MG tablet Commonly known as: NORVASC Take 1 tablet (5 mg total) by mouth daily.   amoxicillin-clavulanate 875-125 MG tablet Commonly known as: Augmentin Take 1 tablet by mouth 2 (two) times daily for 5 days.       No Known Allergies  Consultations:  None   Procedures/Studies: Ct Abdomen Pelvis W Contrast  Result Date: 07/13/2019 CLINICAL DATA:  68 year old female with bilateral flank pain, dysuria and suprapubic pain for 2 days with fever and chills. EXAM: CT ABDOMEN AND PELVIS WITH CONTRAST TECHNIQUE: Multidetector CT imaging of the abdomen and pelvis was performed using the standard protocol following bolus administration of intravenous contrast. CONTRAST:  100mL OMNIPAQUE IOHEXOL 300 MG/ML  SOLN COMPARISON:  Noncontrast CT Abdomen and Pelvis earlier tonight. FINDINGS: Lower chest: Mild cardiomegaly. Similar low lung volumes with mild atelectasis. No pericardial or pleural effusion. Small gastric hiatal hernia. Hepatobiliary: Evidence of hepatic steatosis. Otherwise negative liver and gallbladder. No bile duct enlargement. Pancreas: Negative. Spleen: Negative. Adrenals/Urinary Tract: Normal adrenal glands. Bilateral renal enhancement and contrast excretion is symmetric and within normal limits. Small left lower pole benign parapelvic cysts. Negative course of both ureters. No perinephric  or periureteral stranding. Smaller bladder volume now, possible mild perivesical stranding as seen on  sagittal image 74, but otherwise unremarkable bladder. Stomach/Bowel: Mild retained stool in the rectum. Redundant but largely decompressed sigmoid colon. Mild retained stool in the descending and transverse colon. Redundant hepatic flexure. No large bowel inflammation. Diminutive or absent appendix. Negative terminal ileum. No dilated small bowel. Negative stomach aside from small hiatal hernia. Negative duodenum. No free air, free fluid. Hazy opacity at the root of the small bowel mesentery is less apparent, and significance is doubtful. Vascular/Lymphatic: Mild Aortoiliac calcified atherosclerosis. Major arterial structures are patent. Portal venous system is patent. No lymphadenopathy. Reproductive: Negative. Other: No pelvic free fluid. Musculoskeletal: Lumbar facet degeneration. No acute osseous abnormality identified. IMPRESSION: 1. UTI is not excluded - with questionable mild perivesical inflammatory stranding. But there is no evidence of ascending urinary infection or pyelonephritis. 2. No other acute or inflammatory process identified in the abdomen or pelvis. 3. Mild cardiomegaly. Small hiatal hernia. Lung base atelectasis. Hepatic steatosis. Electronically Signed   By: Odessa Fleming M.D.   On: 07/13/2019 03:30   Dg Chest Portable 1 View  Result Date: 07/12/2019 CLINICAL DATA:  Fever, chills EXAM: PORTABLE CHEST 1 VIEW COMPARISON:  None. FINDINGS: Cardiomegaly. No confluent opacities, effusions or edema. No acute bony abnormality. IMPRESSION: Cardiomegaly.  No active disease. Electronically Signed   By: Charlett Nose M.D.   On: 07/12/2019 23:49   Ct Renal Stone Study  Result Date: 07/12/2019 CLINICAL DATA:  Bilateral flank pain, fever, chills, dysuria. EXAM: CT ABDOMEN AND PELVIS WITHOUT CONTRAST TECHNIQUE: Multidetector CT imaging of the abdomen and pelvis was performed following the standard protocol without IV contrast. COMPARISON:  None. FINDINGS: Lower chest: Lung bases are clear. No effusions.  Heart is normal size. Hepatobiliary: Diffuse low-density throughout the liver compatible with fatty infiltration. No focal abnormality. Gallbladder unremarkable. Pancreas: No focal abnormality or ductal dilatation. Spleen: No focal abnormality.  Normal size. Adrenals/Urinary Tract: No adrenal abnormality. No focal renal abnormality. No stones or hydronephrosis. Urinary bladder is unremarkable. Stomach/Bowel: Stomach, large and small bowel grossly unremarkable. Appendix not seen. No inflammatory process in the right lower quadrant. Vascular/Lymphatic: Aortic atherosclerosis. No enlarged abdominal or pelvic lymph nodes. There is haziness within the mesentery with few mildly prominent mesenteric lymph nodes. Reproductive: Uterus and adnexa unremarkable.  No mass. Other: No free fluid or free air. Musculoskeletal: No acute bony abnormality. IMPRESSION: Fatty infiltration of the liver. No renal or ureteral stones. Haziness in the mesentery with mildly prominent mesenteric lymph nodes. 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 Aortic atherosclerosis. Electronically Signed   By: Charlett Nose M.D.   On: 07/12/2019 23:11       Subjective:  Patient seen and examined at bedside this morning.  Hemodynamically stable for discharge.  Discharge Exam: Vitals:   07/15/19 0322 07/15/19 0400  BP:  (!) 160/79  Pulse:  83  Resp:  18  Temp: 98.2 F (36.8 C) 99.5 F (37.5 C)  SpO2:  93%   Vitals:   07/14/19 2053 07/14/19 2238 07/15/19 0322 07/15/19 0400  BP: (!) 174/79   (!) 160/79  Pulse: 95   83  Resp: 18   18  Temp: 99.6 F (37.6 C) 98.1 F (36.7 C) 98.2 F (36.8 C) 99.5 F (37.5 C)  TempSrc: Oral Oral Oral Oral  SpO2: 99%   93%  Weight:      Height:        General: Pt is alert, awake, not in acute distress  Cardiovascular: RRR, S1/S2 +, no rubs, no gallops Respiratory: CTA bilaterally, no wheezing, no rhonchi Abdominal: Soft, NT, ND, bowel sounds + Extremities: no edema, no cyanosis    The  results of significant diagnostics from this hospitalization (including imaging, microbiology, ancillary and laboratory) are listed below for reference.     Microbiology: Recent Results (from the past 240 hour(s))  Culture, blood (routine x 2)     Status: None (Preliminary result)   Collection Time: 07/12/19  9:40 PM   Specimen: BLOOD RIGHT ARM  Result Value Ref Range Status   Specimen Description   Final    BLOOD RIGHT ARM Performed at [facility], [address] [place], [place] [address]    Special Requests   Final    BOTTLES DRAWN AEROBIC AND ANAEROBIC Blood Culture adequate volume Performed at [facility], [address] [place], [place] [address]    Culture   Final    NO GROWTH 1 DAY Performed at [facility], [address] [place], [place] [address]    Report Status PENDING  Incomplete  Urine culture     Status: Abnormal   Collection Time: 07/12/19 10:01 PM   Specimen: Urine, Random  Result Value Ref Range Status   Specimen Description   Final    URINE, RANDOM Performed at [facility], [address] [place], [place] [address]    Special Requests   Final    NONE Performed at [facility], [address] [place], [place] [address]    Culture MULTIPLE SPECIES PRESENT, SUGGEST RECOLLECTION (A)  Final   Report Status 07/13/2019 FINAL  Final  Culture, blood (routine x 2)     Status: None (Preliminary result)   Collection Time: 07/12/19 10:37 PM   Specimen: BLOOD LEFT HAND  Result Value Ref Range Status   Specimen Description   Final    BLOOD LEFT HAND Performed at [facility], [address] [place], [place] [address]    Special Requests   Final    BOTTLES DRAWN AEROBIC ONLY Blood Culture results may not be optimal due to an inadequate volume of blood received in culture bottles Performed at [facility], [address], [place], [place] [address]    Culture   Final    NO GROWTH 1  DAY Performed at [facility], [address] [place], [place] [address]    Report Status PENDING  Incomplete  SARS CORONAVIRUS 2 (TAT 6-24 HRS) Nasopharyngeal Nasopharyngeal Swab     Status: None   Collection Time: 07/12/19 11:25 PM   Specimen: Nasopharyngeal Swab  Result Value Ref Range Status   SARS Coronavirus 2 NEGATIVE NEGATIVE Final    Comment: (NOTE) SARS-CoV-2 target nucleic acids are NOT DETECTED. The SARS-CoV-2 RNA is generally detectable in upper and lower respiratory specimens during the acute phase of infection. Negative results do not preclude SARS-CoV-2 infection, do not rule out co-infections with other pathogens, and should not be used as the sole basis for treatment or other patient management decisions. Negative results must be combined with clinical observations, patient history, and epidemiological information. The expected result is Negative. Fact Sheet for Patients: HairSlick.no Fact Sheet for Healthcare Providers: quierodirigir.com This test is not yet approved or cleared by the [place] FDA and  has been authorized for detection and/or diagnosis of SARS-CoV-2 by FDA under an Emergency Use Authorization (EUA). This EUA  will remain  in effect (meaning this test can be used) for the duration of the COVID-19 declaration under Section 56 4(b)(1) of the Act, 21 U.S.C. section 360bbb-3(b)(1), unless the authorization is terminated or revoked sooner. Performed at [facility], [address] [place], [place] [address]   MRSA PCR Screening     Status: None   Collection Time: 07/13/19  9:44 AM   Specimen: Nasal Mucosa; Nasopharyngeal  Result Value Ref Range Status   MRSA by PCR NEGATIVE NEGATIVE Final    Comment:        The GeneXpert MRSA Assay (FDA approved for NASAL specimens only), is one component of a comprehensive MRSA colonization surveillance program. It is not intended to  diagnose MRSA infection nor to guide or monitor treatment for MRSA infections. Performed at [facility], [address], [place], [place] [address]      Labs: BNP (last 3 results) No results for input(s): BNP in the last 8760 hours. Basic Metabolic Panel: Recent Labs  Lab 07/12/19 2201 07/13/19 1019 07/14/19 0529 07/15/19 0434  NA 138 140 139 140  K 3.6 4.3 3.7 3.7  CL 103 107 110 107  CO2 GLUCOSE 210* 130* 118* 116*  BUN 6*  CREATININE 0.86 0.74 0.57 0.50  CALCIUM 9.2 8.7* 8.4* 8.8*   Liver Function Tests: Recent Labs  Lab 07/12/19 2201 07/13/19 1019  AST 31 26  ALT 37 30  ALKPHOS 157* 110  BILITOT 0.3 0.6  PROT 8.3* 6.9  ALBUMIN 4.5 3.6   No results for input(s): LIPASE, AMYLASE in the last 168 hours. No results for input(s): AMMONIA in the last 168 hours. CBC: Recent Labs  Lab 07/12/19 2201 07/13/19 1019 07/14/19 0529 07/15/19 0434  WBC 12.6* 28.5* 16.4* 11.3*  NEUTROABS 8.5* 24.9*  --  7.7  HGB 14.5 13.4 11.8* 12.5  HCT 45.1 42.9 36.8 38.9  MCV 88.3 92.1 91.1 90.3  PLT 310 233 195 214   Cardiac Enzymes: No results for input(s): CKTOTAL, CKMB, CKMBINDEX, TROPONINI in the last 168 hours. BNP: Invalid input(s): POCBNP CBG: Recent Labs  Lab 07/14/19 1020 07/15/19 0753  GLUCAP 114* 112*   D-Dimer No results for input(s): DDIMER in the last 72 hours. Hgb A1c No results for input(s): HGBA1C in the last 72 hours. Lipid Profile No results for input(s): CHOL, HDL, LDLCALC, TRIG, CHOLHDL, LDLDIRECT in the last 72 hours. Thyroid function studies No results for input(s): TSH, T4TOTAL, T3FREE, THYROIDAB in the last 72 hours.  Invalid input(s): FREET3 Anemia work up No results for input(s): VITAMINB12, FOLATE, FERRITIN, TIBC, IRON, RETICCTPCT in the last 72 hours. Urinalysis    Component Value Date/Time   COLORURINE YELLOW 07/12/2019 2201   APPEARANCEUR CLEAR 07/12/2019 2201   LABSPEC 1.025 07/12/2019  2201   PHURINE 6.5 07/12/2019 2201   GLUCOSEU NEGATIVE 07/12/2019 2201   HGBUR NEGATIVE 07/12/2019 2201   BILIRUBINUR NEGATIVE 07/12/2019 2201   KETONESUR 15 (A) 07/12/2019 2201   PROTEINUR NEGATIVE 07/12/2019 2201   NITRITE NEGATIVE 07/12/2019 2201   LEUKOCYTESUR NEGATIVE 07/12/2019 2201   Sepsis Labs Invalid input(s): PROCALCITONIN,  WBC,  LACTICIDVEN Microbiology Recent Results (from the past 240 hour(s))  Culture, blood (routine x 2)     Status: None (Preliminary result)   Collection Time: 07/12/19  9:40 PM   Specimen: BLOOD RIGHT ARM  Result Value Ref Range Status   Specimen Description   Final    BLOOD RIGHT ARM  Performed at [facility], [address] [place], [place] [address]    Special Requests   Final    BOTTLES DRAWN AEROBIC AND ANAEROBIC Blood Culture adequate volume Performed at [facility], [address] [place], [place] [address]    Culture   Final    NO GROWTH 1 DAY Performed at [facility], [address] [place], [place] [address]    Report Status PENDING  Incomplete  Urine culture     Status: Abnormal   Collection Time: 07/12/19 10:01 PM   Specimen: Urine, Random  Result Value Ref Range Status   Specimen Description   Final    URINE, RANDOM Performed at [facility], [address] [place], [place] [address]    Special Requests   Final    NONE Performed at [facility], [address] [place], [place] [address]    Culture MULTIPLE SPECIES PRESENT, SUGGEST RECOLLECTION (A)  Final   Report Status 07/13/2019 FINAL  Final  Culture, blood (routine x 2)     Status: None (Preliminary result)   Collection Time: 07/12/19 10:37 PM   Specimen: BLOOD LEFT HAND  Result Value Ref Range Status   Specimen Description   Final    BLOOD LEFT HAND Performed at [facility], [address] [place], [place] [address]    Special Requests   Final    BOTTLES DRAWN AEROBIC ONLY Blood Culture results  may not be optimal due to an inadequate volume of blood received in culture bottles Performed at [facility], [address] [place], [place] [address]    Culture   Final    NO GROWTH 1 DAY Performed at [facility], [address], [place], [place] [address]    Report Status PENDING  Incomplete  SARS CORONAVIRUS 2 (TAT 6-24 HRS) Nasopharyngeal Nasopharyngeal Swab     Status: None   Collection Time: 07/12/19 11:25 PM   Specimen: Nasopharyngeal Swab  Result Value Ref Range Status   SARS Coronavirus 2 NEGATIVE NEGATIVE Final    Comment: (NOTE) SARS-CoV-2 target nucleic acids are NOT DETECTED. The SARS-CoV-2 RNA is generally detectable in upper and lower respiratory specimens during the acute phase of infection. Negative results do not preclude SARS-CoV-2 infection, do not rule out co-infections with other pathogens, and should not be used as the sole basis for treatment or other patient management decisions. Negative results must be combined with clinical observations, patient history, and epidemiological information. The expected result is Negative. Fact Sheet for Patients: HairSlick.no Fact Sheet for Healthcare Providers: quierodirigir.com This test is not yet approved or cleared by the [place] FDA and  has been authorized for detection and/or diagnosis of SARS-CoV-2 by FDA under an Emergency Use Authorization (EUA). This EUA will remain  in effect (meaning this test can be used) for the duration of the COVID-19 declaration under Section 56 4(b)(1) of the Act, 21 U.S.C. section 360bbb-3(b)(1), unless the authorization is terminated or revoked sooner. Performed at [facility], [address], [place], [place] [address]   MRSA PCR Screening     Status: None   Collection Time: 07/13/19  9:44 AM   Specimen: Nasal Mucosa; Nasopharyngeal  Result Value Ref Range Status   MRSA by PCR NEGATIVE  NEGATIVE Final    Comment:        The GeneXpert MRSA Assay (FDA approved for NASAL  specimens only), is one component of a comprehensive MRSA colonization surveillance program. It is not intended to diagnose MRSA infection nor to guide or monitor treatment for MRSA infections. Performed at [facility], [address] [place], [place] [address]     Please note: You were cared for by a hospitalist during your hospital stay. Once you are discharged, your primary care physician will handle any further medical issues. Please note that NO REFILLS for any discharge medications will be authorized once you are discharged, as it is imperative that you return to your primary care physician (or establish a relationship with a primary care physician if you do not have one) for your post hospital discharge needs so that they can reassess your need for medications and monitor your lab values.    Time coordinating discharge: 40 minutes  SIGNED:   Shelly Coss, MD  [facility] 07/15/2019, 10:19 AM Pager [phone]  If 7PM-7AM, please contact night-coverage [email or web address] Password [email or web address]

## 2019-07-15 NOTE — Progress Notes (Signed)
Pt discharged home today per Dr. Tawanna Solo. Pt's IV site D/C'd and WDL. Pt's VSS. Pt's granddaughter provided with home medication list, discharge instructions and prescriptions. Verbalized understanding. Pt left floor via WC in stable condition accompanied by NT.

## 2019-07-15 NOTE — TOC Progression Note (Signed)
Transition of Care [facility]) - Progression Note    Patient Details  Name: Christina Dalton MRN: [id number] Date of Birth: Mar 13, 1951  [facility] of [facility] CM/SW Contact  Purcell Mouton, RN Phone Number: 07/15/2019, 10:46 AM  Clinical Narrative:    Appointment for Dec 23 at 9:20 AM. Pt was encouraged to keep appointment.    Expected Discharge Plan: Home/Self Care Barriers to Discharge: No Barriers Identified  Expected Discharge Plan and Services Expected Discharge Plan: Home/Self Care   Discharge Planning Services: CM Consult, [facility]   Living arrangements for the past 2 months: Single Family Home Expected Discharge Date: 07/15/19                                     Social Determinants of Health (SDOH) Interventions    Readmission Risk Interventions No flowsheet data found.

## 2019-07-15 NOTE — Plan of Care (Signed)
  Problem: Health Behavior/Discharge Planning: Goal: Ability to manage health-related needs will improve Outcome: Completed/Met   Problem: Clinical Measurements: Goal: Ability to maintain clinical measurements within normal limits will improve Outcome: Completed/Met Goal: Will remain free from infection Outcome: Completed/Met Goal: Diagnostic test results will improve Outcome: Completed/Met Goal: Respiratory complications will improve Outcome: Completed/Met Goal: Cardiovascular complication will be avoided Outcome: Completed/Met   Problem: Activity: Goal: Risk for activity intolerance will decrease Outcome: Completed/Met   Problem: Elimination: Goal: Will not experience complications related to bowel motility Outcome: Completed/Met Goal: Will not experience complications related to urinary retention Outcome: Completed/Met   Problem: Pain Managment: Goal: General experience of comfort will improve Outcome: Completed/Met   Problem: Safety: Goal: Ability to remain free from injury will improve Outcome: Completed/Met   Problem: Skin Integrity: Goal: Risk for impaired skin integrity will decrease Outcome: Completed/Met

## 2019-07-18 LAB — CULTURE, BLOOD (ROUTINE X 2)
Culture: NO GROWTH
Culture: NO GROWTH
Special Requests: ADEQUATE

## 2019-08-19 ENCOUNTER — Ambulatory Visit: Payer: Self-pay | Admitting: Family Medicine

## 2019-08-19 ENCOUNTER — Other Ambulatory Visit: Payer: Self-pay

## 2019-09-21 ENCOUNTER — Ambulatory Visit: Payer: Self-pay | Admitting: Family Medicine

## 2019-09-28 DIAGNOSIS — K219 Gastro-esophageal reflux disease without esophagitis: Secondary | ICD-10-CM

## 2019-09-28 DIAGNOSIS — E559 Vitamin D deficiency, unspecified: Secondary | ICD-10-CM

## 2019-09-28 HISTORY — DX: Vitamin D deficiency, unspecified: E55.9

## 2019-09-28 HISTORY — DX: Gastro-esophageal reflux disease without esophagitis: K21.9

## 2019-10-07 ENCOUNTER — Encounter: Payer: Self-pay | Admitting: Family Medicine

## 2019-10-07 ENCOUNTER — Ambulatory Visit (INDEPENDENT_AMBULATORY_CARE_PROVIDER_SITE_OTHER): Payer: Self-pay | Admitting: Family Medicine

## 2019-10-07 ENCOUNTER — Other Ambulatory Visit: Payer: Self-pay

## 2019-10-07 VITALS — BP 136/77 | HR 97 | Temp 98.3°F | Ht 62.0 in | Wt 211.4 lb

## 2019-10-07 DIAGNOSIS — Z87898 Personal history of other specified conditions: Secondary | ICD-10-CM

## 2019-10-07 DIAGNOSIS — B353 Tinea pedis: Secondary | ICD-10-CM

## 2019-10-07 DIAGNOSIS — Z7689 Persons encountering health services in other specified circumstances: Secondary | ICD-10-CM

## 2019-10-07 DIAGNOSIS — I10 Essential (primary) hypertension: Secondary | ICD-10-CM

## 2019-10-07 DIAGNOSIS — Z0000 Encounter for general adult medical examination without abnormal findings: Secondary | ICD-10-CM | POA: Insufficient documentation

## 2019-10-07 DIAGNOSIS — K219 Gastro-esophageal reflux disease without esophagitis: Secondary | ICD-10-CM | POA: Insufficient documentation

## 2019-10-07 DIAGNOSIS — Z09 Encounter for follow-up examination after completed treatment for conditions other than malignant neoplasm: Secondary | ICD-10-CM

## 2019-10-07 LAB — POCT GLYCOSYLATED HEMOGLOBIN (HGB A1C): Hemoglobin A1C: 6 % — AB (ref 4.0–5.6)

## 2019-10-07 LAB — POCT URINALYSIS DIPSTICK
Bilirubin, UA: NEGATIVE
Blood, UA: NEGATIVE
Glucose, UA: NEGATIVE
Ketones, UA: NEGATIVE
Leukocytes, UA: NEGATIVE
Nitrite, UA: NEGATIVE
Protein, UA: NEGATIVE
Spec Grav, UA: >=1.03 — AB (ref 1.010–1.025)
Urobilinogen, UA: 0.2 E.U./dL
pH, UA: 5.5 (ref 5.0–8.0)

## 2019-10-07 LAB — GLUCOSE, POCT (MANUAL RESULT ENTRY): POC Glucose: 119 mg/dl — AB (ref 70–99)

## 2019-10-07 MED ORDER — NYSTATIN 100000 UNIT/GM EX POWD: 1.0000 "application " | Freq: Three times a day (TID) | CUTANEOUS | 1 refills | Status: DC

## 2019-10-07 NOTE — Progress Notes (Signed)
[facility]    New Patient--Hospital Follow Up--Establish Care  Subjective:  Patient ID: Christina Dalton, female    DOB: Aug 28, 1950  Age: 69 y.o. MRN: [id number]  CC:  Chief Complaint  Patient presents with  . New Patient (Initial Visit)  . Fatigue    HPI Christina Dalton is a 69 year old female who presents for Hospital Follow Up and to Establish Care today.   Past Medical History:  Diagnosis Date  . Coronary artery disease   . Hypertension    Current Status: This will be Ms. Paullin's initial office visit with me. She was previously seeing PCP in her country for her PCP needs. Since her last office visit, she is doing well with no complaints. She is originally from [place] and has lived in [place] X almost 2 years. Her daughter here to interpret for her. She denies fevers, chills, fatigue, recent infections, weight loss, and night sweats. She has not had any headaches, visual changes, dizziness, and falls. No chest pain, heart palpitations, cough and shortness of breath reported. She reports acid reflux and flatulence. No reports of other GI problems such as nausea, vomiting, diarrhea, and constipation. She has no reports of blood in stools, dysuria and hematuria. No depression or anxiety reported today. She denies suicidal ideations, homicidal ideations, or auditory hallucinations. She denies pain today.   Past Surgical History:  Procedure Laterality Date  . CARDIAC CATHETERIZATION      No family history on file.  Social History   Socioeconomic History  . Marital status: Married    Spouse name: Not on file  . Number of children: Not on file  . Years of education: Not on file  . Highest education level: Not on file  Occupational History  . Not on file  Tobacco Use  . Smoking status: Never Smoker  . Smokeless tobacco: Never Used  Substance and Sexual Activity  . Alcohol use: No  . Drug use: No  . Sexual activity: Not on  file  Other Topics Concern  . Not on file  Social History Narrative  . Not on file   Social Determinants of Health   Financial Resource Strain:   . Difficulty of Paying Living Expenses: Not on file  Food Insecurity:   . Worried About Programme researcher, broadcasting/film/video in the Last Year: Not on file  . Ran Out of Food in the Last Year: Not on file  Transportation Needs:   . Lack of Transportation (Medical): Not on file  . Lack of Transportation (Non-Medical): Not on file  Physical Activity:   . Days of Exercise per Week: Not on file  . Minutes of Exercise per Session: Not on file  Stress:   . Feeling of Stress : Not on file  Social Connections:   . Frequency of Communication with Friends and Family: Not on file  . Frequency of Social Gatherings with Friends and Family: Not on file  . Attends Religious Services: Not on file  . Active Member of Clubs or Organizations: Not on file  . Attends Banker Meetings: Not on file  . Marital Status: Not on file  Intimate Partner Violence:   . Fear of Current or Ex-Partner: Not on file  . Emotionally Abused: Not on file  . Physically Abused: Not on file  . Sexually Abused: Not on file    Outpatient Medications Prior to Visit  Medication Sig Dispense Refill  . amLODipine (NORVASC) 5 MG  tablet Take 1 tablet (5 mg total) by mouth daily. 30 tablet 0   No facility-administered medications prior to visit.    No Known Allergies  ROS Review of Systems  Constitutional: Positive for fatigue (increased).  HENT: Negative.   Eyes: Negative.   Respiratory: Negative.   Cardiovascular: Negative.   Gastrointestinal: Positive for abdominal distention (obese).       Acid reflux   Endocrine: Negative.   Genitourinary: Negative.   Musculoskeletal: Negative.   Skin: Positive for wound (lower right leg wound. ).  Allergic/Immunologic: Negative.   Neurological: Negative.   Hematological: Negative.   Psychiatric/Behavioral: Negative.         Objective:    Physical Exam  Constitutional: She is oriented to person, place, and time. She appears well-developed and well-nourished.  HENT:  Head: Normocephalic and atraumatic.  Eyes: Conjunctivae are normal.  Cardiovascular: Normal rate, regular rhythm, normal heart sounds and intact distal pulses.  Pulmonary/Chest: Effort normal and breath sounds normal.  Abdominal: Soft. Bowel sounds are normal. She exhibits distension (obese).  Well-healed scar on lower right leg  Musculoskeletal:        General: Normal range of motion.     Cervical back: Normal range of motion and neck supple.  Neurological: She is alert and oriented to person, place, and time. She has normal reflexes.  Skin: Skin is warm and dry.  Psychiatric: She has a normal mood and affect. Her behavior is normal. Judgment and thought content normal.  Nursing note and vitals reviewed.   BP (!) 157/81   Pulse 97   Temp 98.3 F (36.8 C) (Oral)   Ht 5\' 2"  (1.575 m)   Wt 211 lb 6.4 oz (95.9 kg)   SpO2 97%   BMI 38.67 kg/m  Wt Readings from Last 3 Encounters:  10/07/19 211 lb 6.4 oz (95.9 kg)  07/13/19 209 lb 3.5 oz (94.9 kg)  01/08/17 250 lb (113.4 kg)     Health Maintenance Due  Topic Date Due  . Hepatitis C Screening  08/12/50  . TETANUS/TDAP  08/27/1968  . MAMMOGRAM  08/28/1999  . COLONOSCOPY  08/28/1999  . DEXA SCAN  08/27/2014  . PNA vac Low Risk Adult (1 of 2 - PCV13) 08/27/2014  . INFLUENZA VACCINE  03/28/2019    There are no preventive care reminders to display for this patient.  No results found for: TSH Lab Results  Component Value Date   WBC 11.3 (H) 07/15/2019   HGB 12.5 07/15/2019   HCT 38.9 07/15/2019   MCV 90.3 07/15/2019   PLT 214 07/15/2019   Lab Results  Component Value Date   NA 140 07/15/2019   K 3.7 07/15/2019   CO2 24 07/15/2019   GLUCOSE 116 (H) 07/15/2019   BUN 6 (L) 07/15/2019   CREATININE 0.50 07/15/2019   BILITOT 0.6 07/13/2019   ALKPHOS 110 07/13/2019   AST 26  07/13/2019   ALT 30 07/13/2019   PROT 6.9 07/13/2019   ALBUMIN 3.6 07/13/2019   CALCIUM 8.8 (L) 07/15/2019   ANIONGAP 9 07/15/2019   No results found for: CHOL No results found for: HDL No results found for: LDLCALC No results found for: TRIG No results found for: CHOLHDL Lab Results  Component Value Date   HGBA1C 6.0 (A) 10/07/2019      Assessment & Plan:   1. Hospital discharge follow-up  2. History of unexplained fever Resolved.  3. Encounter to establish care  4. Hypertension, unspecified type The current medical regimen is  effective; blood pressure is stable at 136/77 today; continue present plan and medications as prescribed. She will continue to take medications as prescribed, to decrease high sodium intake, excessive alcohol intake, increase potassium intake, smoking cessation, and increase physical activity of at least 30 minutes of cardio activity daily. She will continue to follow Heart Healthy or DASH diet.  5. Gastroesophageal reflux disease without esophagitis - H. pylori breath test  6. Health care maintenance - POCT glucose (manual entry) - POCT urinalysis dipstick - POCT glycosylated hemoglobin (Hb A1C) - CBC with Differential - Comprehensive metabolic panel - Lipid Panel - TSH - Vitamin B12 - Vitamin D, 25-hydroxy - Iron and TIBC([facility]) - H. pylori breath test  7. Follow up She will follow up in 1 months.   No orders of the defined types were placed in this encounter.   Orders Placed This Encounter  Procedures  . CBC with Differential  . Comprehensive metabolic panel  . Lipid Panel  . TSH  . Vitamin B12  . Vitamin D, 25-hydroxy  . Iron and TIBC([facility])  . H. pylori breath test  . POCT glucose (manual entry)  . POCT urinalysis dipstick  . POCT glycosylated hemoglobin (Hb A1C)    Referral Orders  No referral(s) requested today    Kathe Becton,  MSN, FNP-BC [facility] [address] [place], [place] [address] [phone] fax   Problem List Items Addressed This Visit    None    Visit Diagnoses    Hospital discharge follow-up    -  Primary   History of unexplained fever       Encounter to establish care       Hypertension, unspecified type       Gastroesophageal reflux disease without esophagitis       Relevant Orders   H. pylori breath test   Health care maintenance       Relevant Orders   POCT glucose (manual entry) (Completed)   POCT urinalysis dipstick (Completed)   POCT glycosylated hemoglobin (Hb A1C) (Completed)   CBC with Differential   Comprehensive metabolic panel   Lipid Panel   TSH   Vitamin B12   Vitamin D, 25-hydroxy   Iron and TIBC([facility])   H. pylori breath test   Follow up          No orders of the defined types were placed in this encounter.   Follow-up: Return in about 1 month (around 11/04/2019).    Azzie Glatter, FNP

## 2019-10-08 ENCOUNTER — Other Ambulatory Visit: Payer: Self-pay

## 2019-10-08 LAB — CBC WITH DIFFERENTIAL/PLATELET
Basophils Absolute: 0 10*3/uL (ref 0.0–0.2)
Basos: 1 %
EOS (ABSOLUTE): 0.3 10*3/uL (ref 0.0–0.4)
Eos: 4 %
Hematocrit: 42.5 % (ref 34.0–46.6)
Hemoglobin: 14.3 g/dL (ref 11.1–15.9)
Immature Grans (Abs): 0 10*3/uL (ref 0.0–0.1)
Immature Granulocytes: 0 %
Lymphocytes Absolute: 2.4 10*3/uL (ref 0.7–3.1)
Lymphs: 39 %
MCH: 29 pg (ref 26.6–33.0)
MCHC: 33.6 g/dL (ref 31.5–35.7)
MCV: 86 fL (ref 79–97)
Monocytes Absolute: 0.6 10*3/uL (ref 0.1–0.9)
Monocytes: 10 %
Neutrophils Absolute: 2.9 10*3/uL (ref 1.4–7.0)
Neutrophils: 46 %
Platelets: 286 10*3/uL (ref 150–450)
RBC: 4.93 x10E6/uL (ref 3.77–5.28)
RDW: 13.8 % (ref 11.7–15.4)
WBC: 6.3 10*3/uL (ref 3.4–10.8)

## 2019-10-08 LAB — LIPID PANEL
Chol/HDL Ratio: 4 ratio (ref 0.0–4.4)
Cholesterol, Total: 210 mg/dL — ABNORMAL HIGH (ref 100–199)
HDL: 53 mg/dL (ref >39)
LDL Chol Calc (NIH): 133 mg/dL — ABNORMAL HIGH (ref 0–99)
Triglycerides: 136 mg/dL (ref 0–149)
VLDL Cholesterol Cal: 24 mg/dL (ref 5–40)

## 2019-10-08 LAB — COMPREHENSIVE METABOLIC PANEL
ALT: 42 IU/L — ABNORMAL HIGH (ref 0–32)
AST: 34 IU/L (ref 0–40)
Albumin/Globulin Ratio: 1.3 (ref 1.2–2.2)
Albumin: 4.2 g/dL (ref 3.8–4.8)
Alkaline Phosphatase: 175 IU/L — ABNORMAL HIGH (ref 39–117)
BUN/Creatinine Ratio: 24 (ref 12–28)
BUN: 15 mg/dL (ref 8–27)
Bilirubin Total: 0.3 mg/dL (ref 0.0–1.2)
CO2: 21 mmol/L (ref 20–29)
Calcium: 9.6 mg/dL (ref 8.7–10.3)
Chloride: 105 mmol/L (ref 96–106)
Creatinine, Ser: 0.63 mg/dL (ref 0.57–1.00)
GFR calc Af Amer: 105 mL/min/{1.73_m2} (ref >59)
GFR calc non Af Amer: 91 mL/min/{1.73_m2} (ref >59)
Globulin, Total: 3.2 g/dL (ref 1.5–4.5)
Glucose: 111 mg/dL — ABNORMAL HIGH (ref 65–99)
Potassium: 4 mmol/L (ref 3.5–5.2)
Sodium: 143 mmol/L (ref 134–144)
Total Protein: 7.4 g/dL (ref 6.0–8.5)

## 2019-10-08 LAB — VITAMIN D 25 HYDROXY (VIT D DEFICIENCY, FRACTURES): Vit D, 25-Hydroxy: 9.3 ng/mL — ABNORMAL LOW (ref 30.0–100.0)

## 2019-10-08 LAB — IRON AND TIBC
Iron Saturation: 27 % (ref 15–55)
Iron: 86 ug/dL (ref 27–139)
Total Iron Binding Capacity: 316 ug/dL (ref 250–450)
UIBC: 230 ug/dL (ref 118–369)

## 2019-10-08 LAB — TSH: TSH: 2.42 u[IU]/mL (ref 0.450–4.500)

## 2019-10-08 LAB — VITAMIN B12: Vitamin B-12: 554 pg/mL (ref 232–1245)

## 2019-10-09 LAB — H. PYLORI BREATH TEST: H pylori Breath Test: NEGATIVE

## 2019-10-11 ENCOUNTER — Other Ambulatory Visit: Payer: Self-pay | Admitting: Family Medicine

## 2019-10-11 ENCOUNTER — Encounter: Payer: Self-pay | Admitting: Family Medicine

## 2019-10-11 DIAGNOSIS — K219 Gastro-esophageal reflux disease without esophagitis: Secondary | ICD-10-CM

## 2019-10-11 DIAGNOSIS — E559 Vitamin D deficiency, unspecified: Secondary | ICD-10-CM

## 2019-10-11 MED ORDER — FAMOTIDINE 20 MG PO TABS: 20.0000 mg | ORAL_TABLET | Freq: Two times a day (BID) | ORAL | 6 refills | Status: DC | PRN

## 2019-10-11 MED ORDER — VITAMIN D (ERGOCALCIFEROL) 1.25 MG (50000 UNIT) PO CAPS: 50000.0000 [IU] | ORAL_CAPSULE | ORAL | 6 refills | Status: DC

## 2019-11-03 ENCOUNTER — Telehealth: Payer: Self-pay | Admitting: Family Medicine

## 2019-11-03 NOTE — Telephone Encounter (Signed)
Pt was called and reminded of there appointment 

## 2019-11-04 ENCOUNTER — Other Ambulatory Visit: Payer: Self-pay

## 2019-11-04 ENCOUNTER — Ambulatory Visit (INDEPENDENT_AMBULATORY_CARE_PROVIDER_SITE_OTHER): Payer: Self-pay | Admitting: Family Medicine

## 2019-11-04 ENCOUNTER — Encounter: Payer: Self-pay | Admitting: Family Medicine

## 2019-11-04 VITALS — BP 144/82 | HR 71 | Temp 97.8°F | Ht 62.0 in | Wt 209.8 lb

## 2019-11-04 DIAGNOSIS — I10 Essential (primary) hypertension: Secondary | ICD-10-CM

## 2019-11-04 DIAGNOSIS — Z09 Encounter for follow-up examination after completed treatment for conditions other than malignant neoplasm: Secondary | ICD-10-CM

## 2019-11-04 DIAGNOSIS — E559 Vitamin D deficiency, unspecified: Secondary | ICD-10-CM

## 2019-11-04 DIAGNOSIS — K219 Gastro-esophageal reflux disease without esophagitis: Secondary | ICD-10-CM

## 2019-11-04 DIAGNOSIS — B353 Tinea pedis: Secondary | ICD-10-CM

## 2019-11-04 MED ORDER — VITAMIN D (ERGOCALCIFEROL) 1.25 MG (50000 UNIT) PO CAPS: 50000.0000 [IU] | ORAL_CAPSULE | ORAL | 6 refills | Status: DC

## 2019-11-04 MED ORDER — NYSTATIN 100000 UNIT/GM EX POWD: 1.0000 "application " | Freq: Three times a day (TID) | CUTANEOUS | 6 refills | Status: DC

## 2019-11-04 MED ORDER — AMLODIPINE BESYLATE 5 MG PO TABS: 5.0000 mg | ORAL_TABLET | Freq: Every day | ORAL | 6 refills | Status: DC

## 2019-11-04 MED ORDER — FAMOTIDINE 20 MG PO TABS: 20.0000 mg | ORAL_TABLET | Freq: Two times a day (BID) | ORAL | 6 refills | Status: DC | PRN

## 2019-11-04 NOTE — Progress Notes (Signed)
[facility]   Established Patient Office Visit  Subjective:  Patient ID: Christina Dalton, female    DOB: 1951-05-07  Age: 69 y.o. MRN: [id number]  CC:  Chief Complaint  Patient presents with  . Follow-up    HPI Christina Dalton is a 69 year old female who presents for Follow Up today.   Past Medical History:  Diagnosis Date  . Acid reflux 09/2019  . Coronary artery disease   . Hypertension   . Vitamin D deficiency 09/2019   Current Status: Since her last office visit, she is doing well with no complaints.We are using video Arabic Interpreter today. Her daughter is also present today. She states that she is scheduled to travel to [place] on next week. She will be arriving back home 04/2020. She denies visual changes, chest pain, cough, shortness of breath, heart palpitations, and falls. She has occasional headaches and dizziness with position changes. Denies severe headaches, confusion, seizures, double vision, and blurred vision, nausea and vomiting. She denies fevers, chills, fatigue, recent infections, weight loss, and night sweats. No reports of GI problems such as diarrhea, and constipation. She has no reports of blood in stools, dysuria and hematuria. No depression or anxiety reported today. She denies suicidal ideations, homicidal ideations, or auditory hallucinations. She denies pain today.    Past Surgical History:  Procedure Laterality Date  . CARDIAC CATHETERIZATION      History reviewed. No pertinent family history.  Social History   Socioeconomic History  . Marital status: Married    Spouse name: Not on file  . Number of children: Not on file  . Years of education: Not on file  . Highest education level: Not on file  Occupational History  . Not on file  Tobacco Use  . Smoking status: Never Smoker  . Smokeless tobacco: Never Used  Substance and Sexual Activity  . Alcohol use: No  . Drug use: No  . Sexual  activity: Not on file  Other Topics Concern  . Not on file  Social History Narrative  . Not on file   Social Determinants of Health   Financial Resource Strain:   . Difficulty of Paying Living Expenses:   Food Insecurity:   . Worried About Programme researcher, broadcasting/film/video in the Last Year:   . Barista in the Last Year:   Transportation Needs:   . Freight forwarder (Medical):   Marland Kitchen Lack of Transportation (Non-Medical):   Physical Activity:   . Days of Exercise per Week:   . Minutes of Exercise per Session:   Stress:   . Feeling of Stress :   Social Connections:   . Frequency of Communication with Friends and Family:   . Frequency of Social Gatherings with Friends and Family:   . Attends Religious Services:   . Active Member of Clubs or Organizations:   . Attends Banker Meetings:   Marland Kitchen Marital Status:   Intimate Partner Violence:   . Fear of Current or Ex-Partner:   . Emotionally Abused:   Marland Kitchen Physically Abused:   . Sexually Abused:     Outpatient Medications Prior to Visit  Medication Sig Dispense Refill  . amLODipine (NORVASC) 5 MG tablet Take 1 tablet (5 mg total) by mouth daily. 30 tablet 0  . famotidine (PEPCID) 20 MG tablet Take 1 tablet (20 mg total) by mouth 2 (two) times daily as needed for heartburn or indigestion. 60 tablet 6  .  nystatin (MYCOSTATIN/NYSTOP) powder Apply 1 application topically 3 (three) times daily. Apply to feet as needed. 15 g 1  . Vitamin D, Ergocalciferol, (DRISDOL) 1.25 MG (50000 UNIT) CAPS capsule Take 1 capsule (50,000 Units total) by mouth every 7 (seven) days. 5 capsule 6   No facility-administered medications prior to visit.    No Known Allergies  ROS Review of Systems  Constitutional: Negative.   HENT: Negative.   Eyes: Negative.   Respiratory: Negative.   Cardiovascular: Negative.   Gastrointestinal: Negative.   Endocrine: Negative.   Genitourinary: Negative.   Musculoskeletal: Positive for arthralgias  (generalized).  Skin: Negative.   Allergic/Immunologic: Negative.   Neurological: Positive for dizziness (occasional ) and headaches (occasional ).  Hematological: Negative.   Psychiatric/Behavioral: Negative.       Objective:    Physical Exam  Constitutional: She is oriented to person, place, and time. She appears well-developed and well-nourished.  HENT:  Head: Normocephalic and atraumatic.  Eyes: Conjunctivae are normal.  Cardiovascular: Normal rate, normal heart sounds and intact distal pulses.  Pulmonary/Chest: Effort normal and breath sounds normal.  Abdominal: Soft. Bowel sounds are normal. She exhibits distension (obese).  Musculoskeletal:        General: Normal range of motion.     Cervical back: Normal range of motion and neck supple.  Neurological: She is alert and oriented to person, place, and time. She has normal reflexes.  Skin: Skin is warm and dry.  Psychiatric: She has a normal mood and affect. Her behavior is normal. Judgment and thought content normal.  Nursing note and vitals reviewed.   BP (!) 144/82   Pulse 71   Temp 97.8 F (36.6 C)   Ht 5\' 2"  (1.575 m)   Wt 209 lb 12.8 oz (95.2 kg)   SpO2 98%   BMI 38.37 kg/m  Wt Readings from Last 3 Encounters:  11/04/19 209 lb 12.8 oz (95.2 kg)  10/07/19 211 lb 6.4 oz (95.9 kg)  07/13/19 209 lb 3.5 oz (94.9 kg)     Health Maintenance Due  Topic Date Due  . Hepatitis C Screening  Never done  . TETANUS/TDAP  Never done  . MAMMOGRAM  Never done  . COLONOSCOPY  Never done  . DEXA SCAN  Never done  . PNA vac Low Risk Adult (1 of 2 - PCV13) Never done  . INFLUENZA VACCINE  Never done    There are no preventive care reminders to display for this patient.  Lab Results  Component Value Date   TSH 2.420 10/07/2019   Lab Results  Component Value Date   WBC 6.3 10/07/2019   HGB 14.3 10/07/2019   HCT 42.5 10/07/2019   MCV 86 10/07/2019   PLT 286 10/07/2019   Lab Results  Component Value Date   NA  143 10/07/2019   K 4.0 10/07/2019   CO2 21 10/07/2019   GLUCOSE 111 (H) 10/07/2019   BUN 15 10/07/2019   CREATININE 0.63 10/07/2019   BILITOT 0.3 10/07/2019   ALKPHOS 175 (H) 10/07/2019   AST 34 10/07/2019   ALT 42 (H) 10/07/2019   PROT 7.4 10/07/2019   ALBUMIN 4.2 10/07/2019   CALCIUM 9.6 10/07/2019   ANIONGAP 9 07/15/2019   Lab Results  Component Value Date   CHOL 210 (H) 10/07/2019   Lab Results  Component Value Date   HDL 53 10/07/2019   Lab Results  Component Value Date   LDLCALC 133 (H) 10/07/2019   Lab Results  Component Value Date  TRIG 136 10/07/2019   Lab Results  Component Value Date   CHOLHDL 4.0 10/07/2019   Lab Results  Component Value Date   HGBA1C 6.0 (A) 10/07/2019      Assessment & Plan:   1. Hypertension, unspecified type The current medical regimen is effective; blood pressure is stable at 144/82 today; continue present plan and medications as prescribed. She will continue to take medications as prescribed, to decrease high sodium intake, excessive alcohol intake, increase potassium intake, smoking cessation, and increase physical activity of at least 30 minutes of cardio activity daily. She will continue to follow Heart Healthy or DASH diet. - POCT urinalysis dipstick - amLODipine (NORVASC) 5 MG tablet; Take 1 tablet (5 mg total) by mouth daily.  Dispense: 30 tablet; Refill: 6  2. Gastroesophageal reflux disease without esophagitis - famotidine (PEPCID) 20 MG tablet; Take 1 tablet (20 mg total) by mouth 2 (two) times daily as needed for heartburn or indigestion.  Dispense: 60 tablet; Refill: 6  3. Tinea pedis of both feet - nystatin (MYCOSTATIN/NYSTOP) powder; Apply 1 application topically 3 (three) times daily. Apply to feet as needed.  Dispense: 15 g; Refill: 6  4. Vitamin D deficiency - Vitamin D, Ergocalciferol, (DRISDOL) 1.25 MG (50000 UNIT) CAPS capsule; Take 1 capsule (50,000 Units total) by mouth every 7 (seven) days.  Dispense: 5  capsule; Refill: 6  5. Follow up She will follow up in 7 months once she returns to the [place].   Meds ordered this encounter  Medications  . amLODipine (NORVASC) 5 MG tablet    Sig: Take 1 tablet (5 mg total) by mouth daily.    Dispense:  30 tablet    Refill:  6  . famotidine (PEPCID) 20 MG tablet    Sig: Take 1 tablet (20 mg total) by mouth 2 (two) times daily as needed for heartburn or indigestion.    Dispense:  60 tablet    Refill:  6  . nystatin (MYCOSTATIN/NYSTOP) powder    Sig: Apply 1 application topically 3 (three) times daily. Apply to feet as needed.    Dispense:  15 g    Refill:  6  . Vitamin D, Ergocalciferol, (DRISDOL) 1.25 MG (50000 UNIT) CAPS capsule    Sig: Take 1 capsule (50,000 Units total) by mouth every 7 (seven) days.    Dispense:  5 capsule    Refill:  6    Orders Placed This Encounter  Procedures  . POCT urinalysis dipstick    Referral Orders  No referral(s) requested today    Raliegh Ip,  MSN, FNP-BC [facility] [address] [place], [place] [address] [phone]- fax  Problem List Items Addressed This Visit      Digestive   Gastroesophageal reflux disease without esophagitis   Relevant Medications   famotidine (PEPCID) 20 MG tablet    Other Visit Diagnoses    Hypertension, unspecified type    -  Primary   Relevant Medications   amLODipine (NORVASC) 5 MG tablet   Other Relevant Orders   POCT urinalysis dipstick   Tinea pedis of both feet       Relevant Medications   nystatin (MYCOSTATIN/NYSTOP) powder   Vitamin D deficiency       Relevant Medications   Vitamin D, Ergocalciferol, (DRISDOL) 1.25 MG (50000 UNIT) CAPS capsule   Follow up          Meds ordered this encounter  Medications  .  amLODipine (NORVASC) 5 MG tablet    Sig: Take 1 tablet (5 mg total) by mouth daily.    Dispense:  30 tablet    Refill:  6  . famotidine (PEPCID) 20 MG  tablet    Sig: Take 1 tablet (20 mg total) by mouth 2 (two) times daily as needed for heartburn or indigestion.    Dispense:  60 tablet    Refill:  6  . nystatin (MYCOSTATIN/NYSTOP) powder    Sig: Apply 1 application topically 3 (three) times daily. Apply to feet as needed.    Dispense:  15 g    Refill:  6  . Vitamin D, Ergocalciferol, (DRISDOL) 1.25 MG (50000 UNIT) CAPS capsule    Sig: Take 1 capsule (50,000 Units total) by mouth every 7 (seven) days.    Dispense:  5 capsule    Refill:  6    Follow-up: Return in about 7 months (around 06/05/2020).    Azzie Glatter, FNP

## 2019-11-20 ENCOUNTER — Ambulatory Visit: Payer: Self-pay | Attending: Internal Medicine

## 2019-11-20 DIAGNOSIS — Z20822 Contact with and (suspected) exposure to covid-19: Secondary | ICD-10-CM

## 2019-11-21 LAB — SARS-COV-2, NAA 2 DAY TAT

## 2019-11-21 LAB — NOVEL CORONAVIRUS, NAA: SARS-CoV-2, NAA: NOT DETECTED

## 2020-06-06 ENCOUNTER — Ambulatory Visit: Payer: Self-pay | Admitting: Family Medicine

## 2020-07-31 IMAGING — CT CT ABD-PELV W/ CM
2 of 5 series · 16 of 46 positions shown, 18 images · IV contrast (Omnipaque)
Comparison: Noncontrast CT Abdomen and Pelvis earlier tonight.

CLINICAL DATA: 68-year-old female with bilateral flank pain,
dysuria and suprapubic pain for 2 days with fever and chills.

EXAM:
CT ABDOMEN AND PELVIS WITH CONTRAST
TECHNIQUE: Multidetector CT imaging of the abdomen and pelvis was performed
using the standard protocol following bolus administration of
intravenous contrast.
CONTRAST:  100mL OMNIPAQUE IOHEXOL 300 MG/ML  SOLN

[Series 2: axial st · axial · 0.96mm/px · z∈[-479,+1]mm · 13 of 108 slices shown, 15 images]
[im 6/108  soft-tissue]
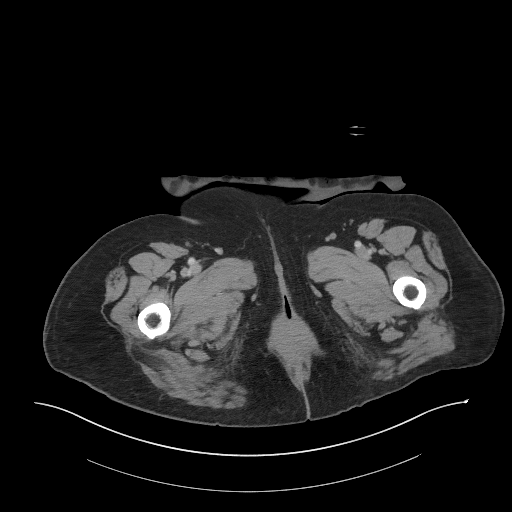
[im 6/108  bone]
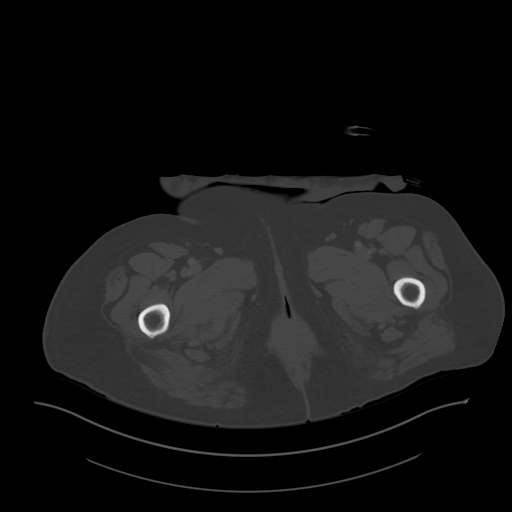
[im 12/108  soft-tissue]
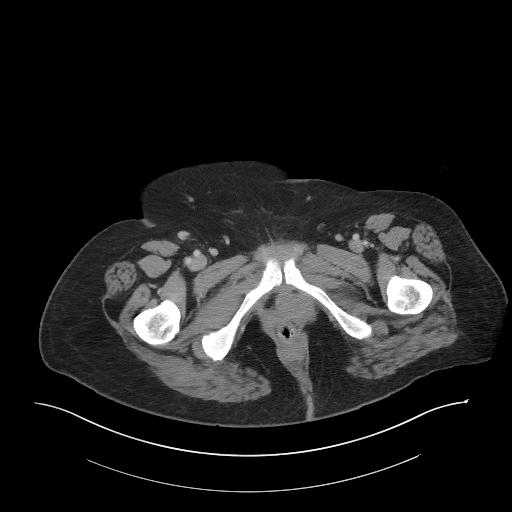
[im 24/108  soft-tissue]
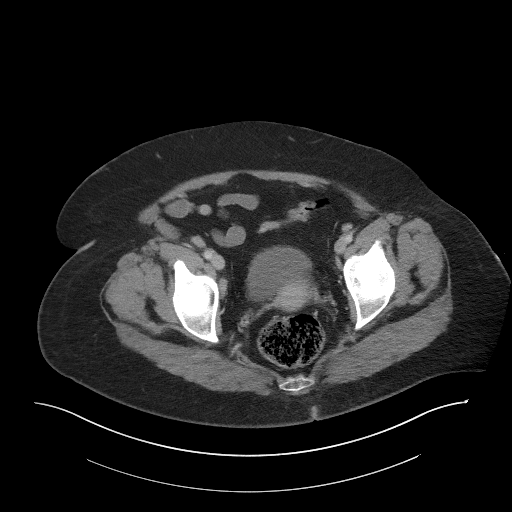
[im 30/108  soft-tissue]
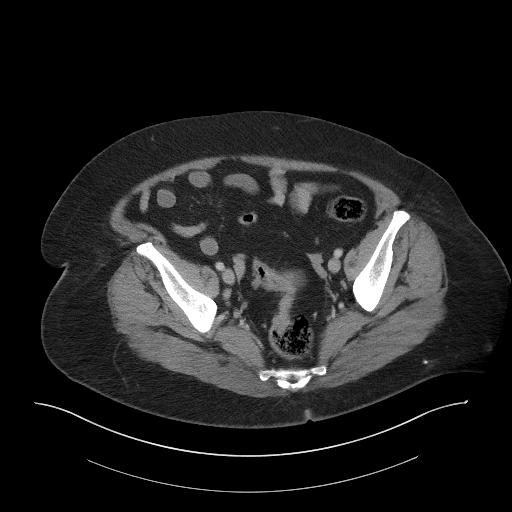
[im 36/108  soft-tissue]
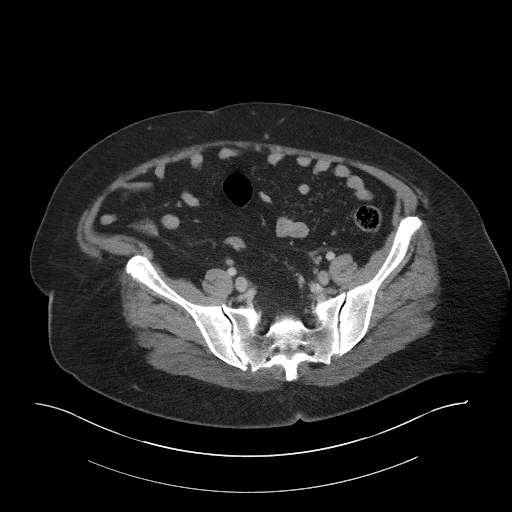
[im 48/108  soft-tissue]
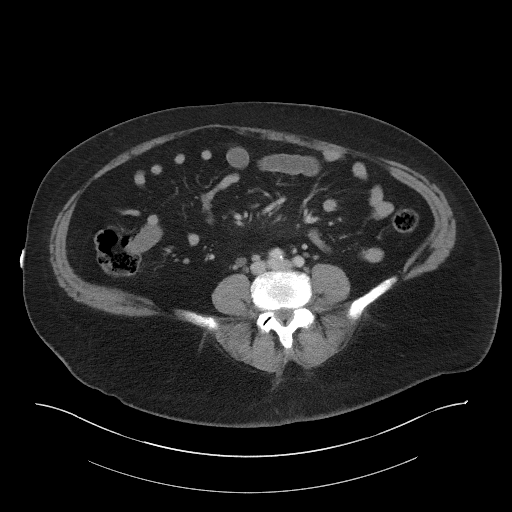
[im 54/108  soft-tissue]
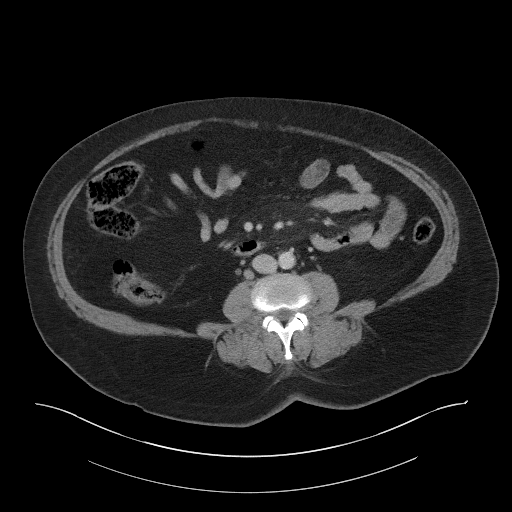
[im 60/108  soft-tissue]
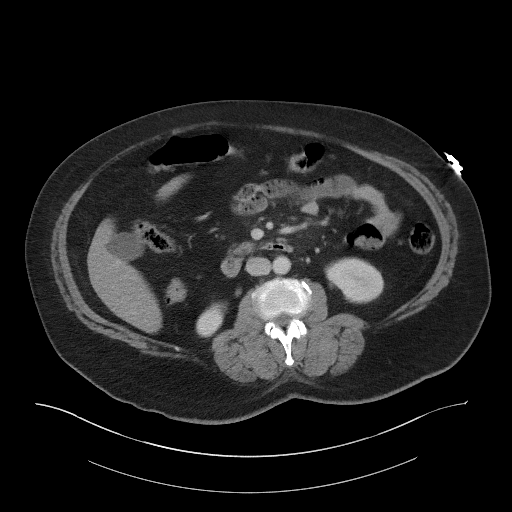
[im 72/108  soft-tissue]
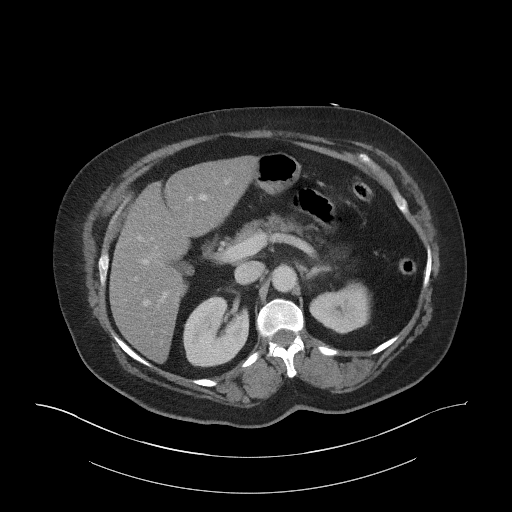
[im 72/108  bone]
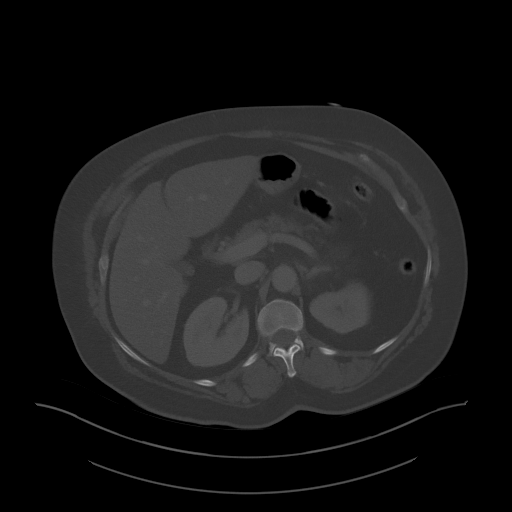
[im 78/108  soft-tissue]
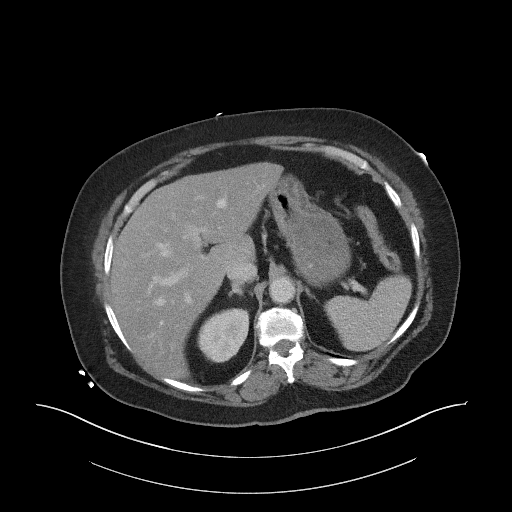
[im 84/108  soft-tissue]
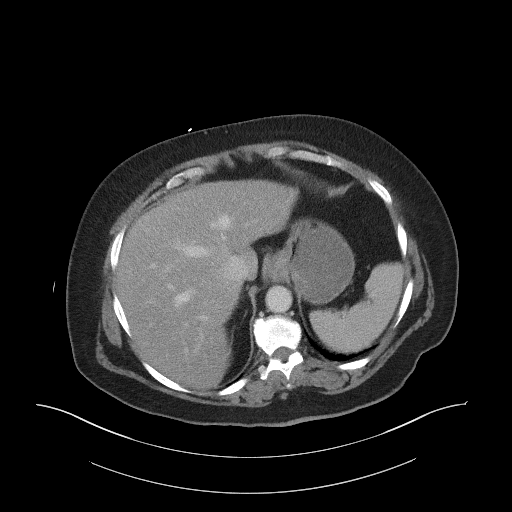
[im 96/108  soft-tissue]
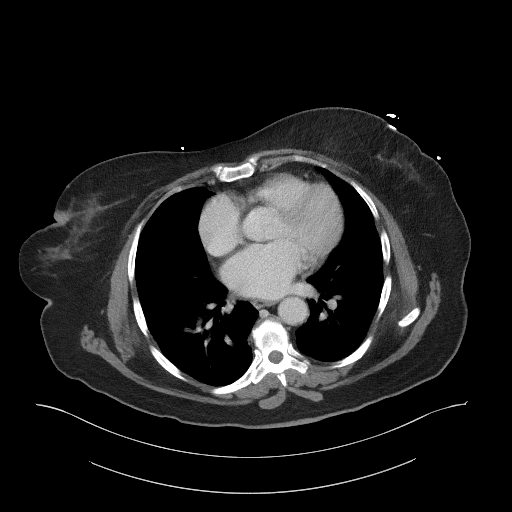
[im 102/108  soft-tissue]
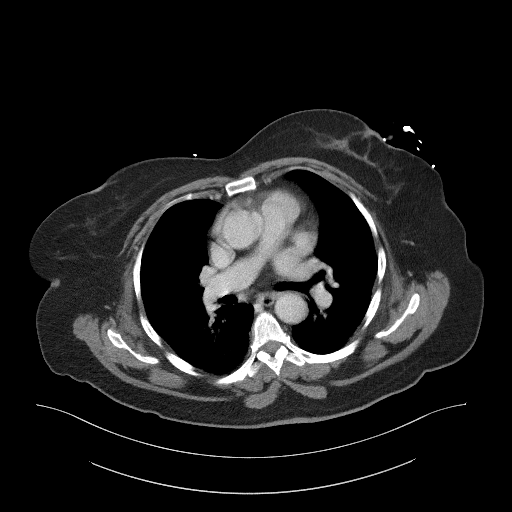

[Series 5: coronal st · coronal · 0.90mm/px · 3 of 92 slices shown]
[im 31/92  soft-tissue]
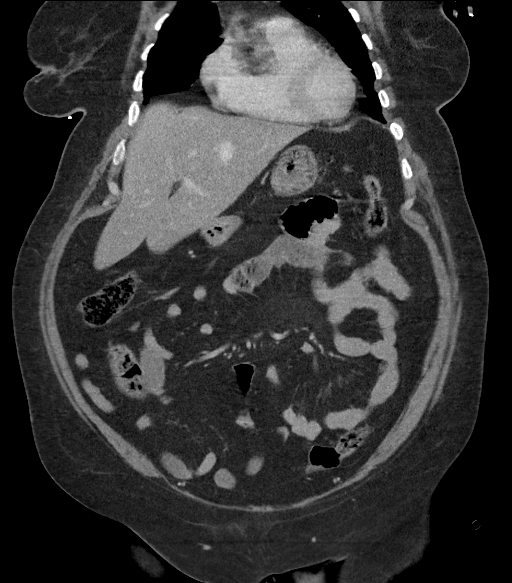
[im 41/92  soft-tissue]
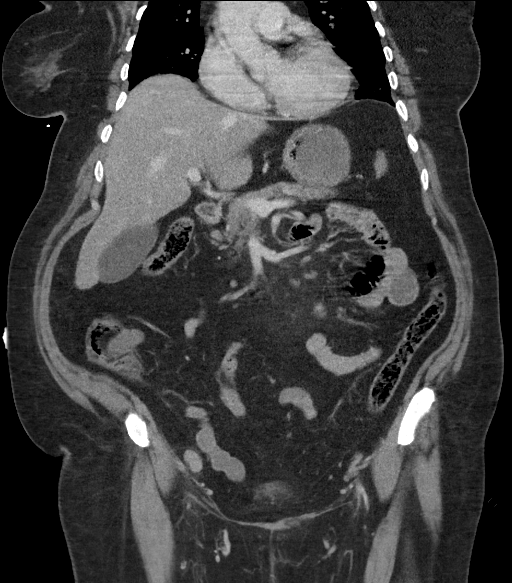
[im 51/92  soft-tissue]
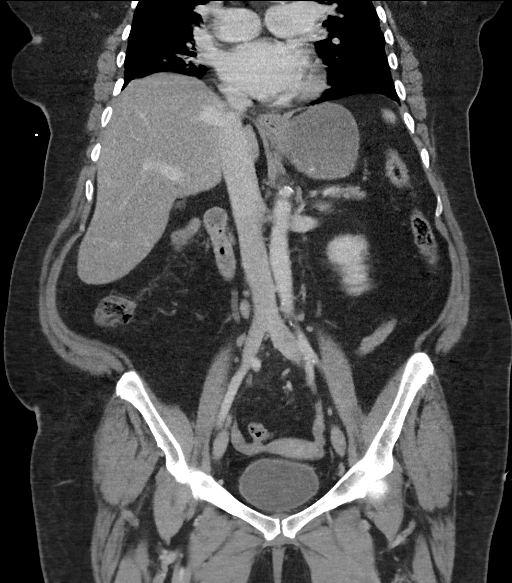

[16 of 46 positions shown; findings below may reference images not displayed]

FINDINGS: Lower chest: Mild cardiomegaly. Similar low lung volumes with mild
atelectasis. No pericardial or pleural effusion. Small gastric
hiatal hernia.

Hepatobiliary: Evidence of hepatic steatosis. Otherwise negative
liver and gallbladder. No bile duct enlargement.

Pancreas: Negative.

Spleen: Negative.

Adrenals/Urinary Tract: Normal adrenal glands.

Bilateral renal enhancement and contrast excretion is symmetric and
within normal limits. Small left lower pole benign parapelvic cysts.
Negative course of both ureters. No perinephric or periureteral
stranding. Smaller bladder volume now, possible mild perivesical
stranding as seen on sagittal image 74, but otherwise unremarkable
bladder.

Stomach/Bowel: Mild retained stool in the rectum. Redundant but
largely decompressed sigmoid colon. Mild retained stool in the
descending and transverse colon. Redundant hepatic flexure. No large
bowel inflammation. Diminutive or absent appendix. Negative terminal
ileum. No dilated small bowel. Negative stomach aside from small
hiatal hernia. Negative duodenum. No free air, free fluid. Hazy
opacity at the root of the small bowel mesentery is less apparent,
and significance is doubtful.

Vascular/Lymphatic: Mild Aortoiliac calcified atherosclerosis. Major
arterial structures are patent. Portal venous system is patent. No
lymphadenopathy.

Reproductive: Negative.

Other: No pelvic free fluid.

Musculoskeletal: Lumbar facet degeneration. No acute osseous
abnormality identified.
IMPRESSION: 1. UTI is not excluded - with questionable mild perivesical
inflammatory stranding. But there is no evidence of ascending
urinary infection or pyelonephritis.
2. No other acute or inflammatory process identified in the abdomen
or pelvis.
3. Mild cardiomegaly. Small hiatal hernia. Lung base atelectasis.
Hepatic steatosis.

## 2020-08-18 ENCOUNTER — Telehealth: Payer: Self-pay | Admitting: Family Medicine

## 2020-08-18 ENCOUNTER — Other Ambulatory Visit: Payer: Self-pay | Admitting: Family Medicine

## 2020-08-18 DIAGNOSIS — I10 Essential (primary) hypertension: Secondary | ICD-10-CM

## 2020-08-18 MED ORDER — AMLODIPINE BESYLATE 5 MG PO TABS: 5.0000 mg | ORAL_TABLET | Freq: Every day | ORAL | 0 refills | Status: DC

## 2020-08-18 NOTE — Telephone Encounter (Signed)
Done

## 2021-05-22 ENCOUNTER — Encounter: Payer: Self-pay | Admitting: Nurse Practitioner

## 2021-05-22 ENCOUNTER — Other Ambulatory Visit: Payer: Self-pay

## 2021-05-22 ENCOUNTER — Ambulatory Visit (INDEPENDENT_AMBULATORY_CARE_PROVIDER_SITE_OTHER): Payer: Self-pay | Admitting: Nurse Practitioner

## 2021-05-22 VITALS — BP 167/81 | HR 65 | Temp 97.4°F | Ht 62.0 in | Wt 207.0 lb

## 2021-05-22 DIAGNOSIS — R7303 Prediabetes: Secondary | ICD-10-CM

## 2021-05-22 DIAGNOSIS — I10 Essential (primary) hypertension: Secondary | ICD-10-CM

## 2021-05-22 DIAGNOSIS — E559 Vitamin D deficiency, unspecified: Secondary | ICD-10-CM

## 2021-05-22 DIAGNOSIS — K219 Gastro-esophageal reflux disease without esophagitis: Secondary | ICD-10-CM

## 2021-05-22 LAB — POCT GLYCOSYLATED HEMOGLOBIN (HGB A1C)
HbA1c POC (<> result, manual entry): 6.5 % (ref 4.0–5.6)
HbA1c, POC (controlled diabetic range): 6.5 % (ref 0.0–7.0)
HbA1c, POC (prediabetic range): 6.5 % — AB (ref 5.7–6.4)
Hemoglobin A1C: 6.5 % — AB (ref 4.0–5.6)

## 2021-05-22 MED ORDER — VITAMIN D (ERGOCALCIFEROL) 1.25 MG (50000 UNIT) PO CAPS: 50000.0000 [IU] | ORAL_CAPSULE | ORAL | 6 refills | Status: DC

## 2021-05-22 MED ORDER — FAMOTIDINE 20 MG PO TABS: 20.0000 mg | ORAL_TABLET | Freq: Two times a day (BID) | ORAL | 6 refills | Status: AC | PRN

## 2021-05-22 MED ORDER — AMLODIPINE BESYLATE 5 MG PO TABS: 5.0000 mg | ORAL_TABLET | Freq: Every day | ORAL | 0 refills | Status: DC

## 2021-05-22 NOTE — Patient Instructions (Signed)
You were seen today in the [facility] for follow up of chronic illness. Labs were collected, results will be available via MyChart or, if abnormal, you will be contacted by clinic staff. You were prescribed medications, please take as directed. Please follow up in 3 mths for reevaluation.  °

## 2021-05-22 NOTE — Progress Notes (Signed)
[facility] Patient [facility] [address] [place], [place]  [address] Phone:  [phone]   Fax:  [phone] Subjective:   Patient ID: Christina Dalton, female    DOB: Jun 07, 1951, 70 y.o.   MRN: [id number]  Chief Complaint  Patient presents with   Follow-up    Medication refills, amlodipine has been out 2 week.    HPI Christina Dalton 69 y.o. female presents with history of CAD and hypertension to the [facility] for follow up of chronic illness. Patient brought in by daughter who is assisting with translation during visit. Patient states that she has not has B/P medication in 2 wks. Take B/P at home, when on medication the systolic in 150, when not on medications it is 170, diastolic unknown Daughter states that patient does not take medications regularly, typically only when she begins to feel bad. Does not adhere to a specific diet, but is very active throughout the day.   Denies any other complaints today. Denies any chest pain, shortness of breath, HA or dizziness. Denies any blurred vision, numbness or tingling. Requesting refill of medications.   Past Medical History:  Diagnosis Date   Acid reflux 09/2019   Coronary artery disease    Hypertension    Vitamin D deficiency 09/2019    Past Surgical History:  Procedure Laterality Date   CARDIAC CATHETERIZATION      No family history on file.  Social History   Socioeconomic History   Marital status: Married    Spouse name: Not on file   Number of children: Not on file   Years of education: Not on file   Highest education level: Not on file  Occupational History   Not on file  Tobacco Use   Smoking status: Never   Smokeless tobacco: Never  Vaping Use   Vaping Use: Never used  Substance and Sexual Activity   Alcohol use: No   Drug use: No   Sexual activity: Not on file  Other Topics Concern   Not on file  Social History Narrative   Not on file   Social Determinants of Health   Financial Resource Strain:  Not on file  Food Insecurity: Not on file  Transportation Needs: Not on file  Physical Activity: Not on file  Stress: Not on file  Social Connections: Not on file  Intimate Partner Violence: Not on file    Outpatient Medications Prior to Visit  Medication Sig Dispense Refill   amLODipine (NORVASC) 5 MG tablet Take 1 tablet (5 mg total) by mouth daily. 90 tablet 0   Vitamin D, Ergocalciferol, (DRISDOL) 1.25 MG (50000 UNIT) CAPS capsule Take 1 capsule (50,000 Units total) by mouth every 7 (seven) days. 5 capsule 6   famotidine (PEPCID) 20 MG tablet Take 1 tablet (20 mg total) by mouth 2 (two) times daily as needed for heartburn or indigestion. 60 tablet 6   nystatin (MYCOSTATIN/NYSTOP) powder Apply 1 application topically 3 (three) times daily. Apply to feet as needed. 15 g 6   No facility-administered medications prior to visit.    No Known Allergies  Review of Systems  Constitutional:  Negative for chills, fever and malaise/fatigue.  HENT: Negative.    Eyes: Negative.   Respiratory:  Negative for cough and shortness of breath.   Cardiovascular:  Negative for chest pain, palpitations and leg swelling.  Gastrointestinal:  Negative for abdominal pain, blood in stool, constipation, diarrhea, nausea and vomiting.  Musculoskeletal: Negative.   Skin: Negative.   Neurological:  Negative.   Psychiatric/Behavioral:  Negative for depression. The patient is not nervous/anxious.   All other systems reviewed and are negative.     Objective:    Physical Exam Vitals reviewed.  Constitutional:      General: She is not in acute distress.    Appearance: Normal appearance.  HENT:     Head: Normocephalic.  Cardiovascular:     Rate and Rhythm: Normal rate and regular rhythm.     Pulses: Normal pulses.     Heart sounds: Normal heart sounds.     Comments: No obvious peripheral edema Pulmonary:     Effort: Pulmonary effort is normal.     Breath sounds: Normal breath sounds.   Musculoskeletal:        General: Normal range of motion.     Cervical back: Normal range of motion and neck supple.  Skin:    General: Skin is warm and dry.     Capillary Refill: Capillary refill takes less than 2 seconds.  Neurological:     General: No focal deficit present.     Mental Status: She is alert and oriented to person, place, and time.  Psychiatric:        Mood and Affect: Mood normal.        Behavior: Behavior normal.        Thought Content: Thought content normal.        Judgment: Judgment normal.    BP (!) 167/81 (BP Location: Right Arm, Patient Position: Sitting)   Pulse 65   Temp (!) 97.4 F (36.3 C)   Ht 5\' 2"  (1.575 m)   Wt 207 lb 0.2 oz (93.9 kg)   SpO2 96%   BMI 37.86 kg/m  Wt Readings from Last 3 Encounters:  05/22/21 207 lb 0.2 oz (93.9 kg)  11/04/19 209 lb 12.8 oz (95.2 kg)  10/07/19 211 lb 6.4 oz (95.9 kg)    Immunization History  Administered Date(s) Administered   Janssen (J&J) SARS-COV-2 Vaccination 11/08/2019    Diabetic Foot Exam - Simple   No data filed     Lab Results  Component Value Date   TSH 2.420 10/07/2019   Lab Results  Component Value Date   WBC 6.3 10/07/2019   HGB 14.3 10/07/2019   HCT 42.5 10/07/2019   MCV 86 10/07/2019   PLT 286 10/07/2019   Lab Results  Component Value Date   NA 143 10/07/2019   K 4.0 10/07/2019   CO2 21 10/07/2019   GLUCOSE 111 (H) 10/07/2019   BUN 15 10/07/2019   CREATININE 0.63 10/07/2019   BILITOT 0.3 10/07/2019   ALKPHOS 175 (H) 10/07/2019   AST 34 10/07/2019   ALT 42 (H) 10/07/2019   PROT 7.4 10/07/2019   ALBUMIN 4.2 10/07/2019   CALCIUM 9.6 10/07/2019   ANIONGAP 9 07/15/2019   Lab Results  Component Value Date   CHOL 210 (H) 10/07/2019   Lab Results  Component Value Date   HDL 53 10/07/2019   Lab Results  Component Value Date   LDLCALC 133 (H) 10/07/2019   Lab Results  Component Value Date   TRIG 136 10/07/2019   Lab Results  Component Value Date   CHOLHDL 4.0  10/07/2019   Lab Results  Component Value Date   HGBA1C 6.5 (A) 05/22/2021   HGBA1C 6.5 05/22/2021   HGBA1C 6.5 (A) 05/22/2021   HGBA1C 6.5 05/22/2021       Assessment & Plan:   Problem List Items Addressed This Visit  Digestive   Gastroesophageal reflux disease without esophagitis   Relevant Medications   famotidine (PEPCID) 20 MG tablet   Other Visit Diagnoses     Prediabetes    -  Primary   Relevant Orders   HgB A1c (Completed): 6.5, prediabetes. Will reevaluate at follow up, will consider initiation of metformin.    Hypertension, unspecified type       Relevant Medications   amLODipine (NORVASC) 5 MG tablet   Other Relevant Orders   CBC with Differential/Platelet   Comprehensive metabolic panel   Lipid panel Encouraged continued diet and exercise efforts  Encouraged continued compliance with medication  Discussed at length the importance of taking medications daily  Informed to continue monitoring B/P daily    Vitamin D deficiency       Relevant Medications   Vitamin D, Ergocalciferol, (DRISDOL) 1.25 MG (50000 UNIT) CAPS capsule   Follow up in 3 mths for reevaluation of B/P and diet/ exercise, sooner as needed.    I have discontinued Christina Dalton's nystatin. I am also having her maintain her amLODipine, Vitamin D (Ergocalciferol), and famotidine.  Meds ordered this encounter  Medications   amLODipine (NORVASC) 5 MG tablet    Sig: Take 1 tablet (5 mg total) by mouth daily.    Dispense:  90 tablet    Refill:  0   Vitamin D, Ergocalciferol, (DRISDOL) 1.25 MG (50000 UNIT) CAPS capsule    Sig: Take 1 capsule (50,000 Units total) by mouth every 7 (seven) days.    Dispense:  5 capsule    Refill:  6   famotidine (PEPCID) 20 MG tablet    Sig: Take 1 tablet (20 mg total) by mouth 2 (two) times daily as needed for heartburn or indigestion.    Dispense:  60 tablet    Refill:  6     Kathrynn Speed, NP

## 2021-05-23 LAB — LIPID PANEL
Chol/HDL Ratio: 4.5 ratio — ABNORMAL HIGH (ref 0.0–4.4)
Cholesterol, Total: 232 mg/dL — ABNORMAL HIGH (ref 100–199)
HDL: 52 mg/dL (ref >39)
LDL Chol Calc (NIH): 159 mg/dL — ABNORMAL HIGH (ref 0–99)
Triglycerides: 118 mg/dL (ref 0–149)
VLDL Cholesterol Cal: 21 mg/dL (ref 5–40)

## 2021-05-23 LAB — COMPREHENSIVE METABOLIC PANEL
ALT: 26 IU/L (ref 0–32)
AST: 22 IU/L (ref 0–40)
Albumin/Globulin Ratio: 1.6 (ref 1.2–2.2)
Albumin: 4.5 g/dL (ref 3.7–4.7)
Alkaline Phosphatase: 131 IU/L — ABNORMAL HIGH (ref 44–121)
BUN/Creatinine Ratio: 25 (ref 12–28)
BUN: 17 mg/dL (ref 8–27)
Bilirubin Total: 0.2 mg/dL (ref 0.0–1.2)
CO2: 23 mmol/L (ref 20–29)
Calcium: 9.8 mg/dL (ref 8.7–10.3)
Chloride: 103 mmol/L (ref 96–106)
Creatinine, Ser: 0.69 mg/dL (ref 0.57–1.00)
Globulin, Total: 2.8 g/dL (ref 1.5–4.5)
Glucose: 130 mg/dL — ABNORMAL HIGH (ref 70–99)
Potassium: 4.6 mmol/L (ref 3.5–5.2)
Sodium: 142 mmol/L (ref 134–144)
Total Protein: 7.3 g/dL (ref 6.0–8.5)
eGFR: 93 mL/min/{1.73_m2} (ref >59)

## 2021-05-23 LAB — CBC WITH DIFFERENTIAL/PLATELET
Basophils Absolute: 0 10*3/uL (ref 0.0–0.2)
Basos: 1 %
EOS (ABSOLUTE): 0.2 10*3/uL (ref 0.0–0.4)
Eos: 3 %
Hematocrit: 43.1 % (ref 34.0–46.6)
Hemoglobin: 14.1 g/dL (ref 11.1–15.9)
Immature Grans (Abs): 0 10*3/uL (ref 0.0–0.1)
Immature Granulocytes: 0 %
Lymphocytes Absolute: 2.5 10*3/uL (ref 0.7–3.1)
Lymphs: 41 %
MCH: 27.9 pg (ref 26.6–33.0)
MCHC: 32.7 g/dL (ref 31.5–35.7)
MCV: 85 fL (ref 79–97)
Monocytes Absolute: 0.5 10*3/uL (ref 0.1–0.9)
Monocytes: 8 %
Neutrophils Absolute: 2.9 10*3/uL (ref 1.4–7.0)
Neutrophils: 47 %
Platelets: 266 10*3/uL (ref 150–450)
RBC: 5.06 x10E6/uL (ref 3.77–5.28)
RDW: 13.6 % (ref 11.7–15.4)
WBC: 6.2 10*3/uL (ref 3.4–10.8)

## 2021-08-03 ENCOUNTER — Other Ambulatory Visit: Payer: Self-pay | Admitting: Nurse Practitioner

## 2021-08-03 DIAGNOSIS — I10 Essential (primary) hypertension: Secondary | ICD-10-CM

## 2021-10-16 ENCOUNTER — Encounter: Payer: Self-pay | Admitting: Nurse Practitioner

## 2021-10-16 ENCOUNTER — Ambulatory Visit (INDEPENDENT_AMBULATORY_CARE_PROVIDER_SITE_OTHER): Payer: Self-pay | Admitting: Nurse Practitioner

## 2021-10-16 ENCOUNTER — Other Ambulatory Visit: Payer: Self-pay

## 2021-10-16 VITALS — BP 150/81 | HR 70 | Temp 97.5°F | Ht 62.0 in | Wt 210.5 lb

## 2021-10-16 DIAGNOSIS — I10 Essential (primary) hypertension: Secondary | ICD-10-CM

## 2021-10-16 DIAGNOSIS — E1169 Type 2 diabetes mellitus with other specified complication: Secondary | ICD-10-CM

## 2021-10-16 LAB — POCT GLYCOSYLATED HEMOGLOBIN (HGB A1C)
HbA1c POC (<> result, manual entry): 7.6 % (ref 4.0–5.6)
HbA1c, POC (controlled diabetic range): 7.6 % — AB (ref 0.0–7.0)
HbA1c, POC (prediabetic range): 7.6 % — AB (ref 5.7–6.4)
Hemoglobin A1C: 7.6 % — AB (ref 4.0–5.6)

## 2021-10-16 MED ORDER — AMLODIPINE BESYLATE 10 MG PO TABS: 10.0000 mg | ORAL_TABLET | Freq: Every day | ORAL | 2 refills | Status: AC

## 2021-10-16 MED ORDER — LISINOPRIL 20 MG PO TABS: 20.0000 mg | ORAL_TABLET | Freq: Every day | ORAL | 2 refills | Status: DC

## 2021-10-16 MED ORDER — METFORMIN HCL 500 MG PO TABS: 500.0000 mg | ORAL_TABLET | Freq: Three times a day (TID) | ORAL | 2 refills | Status: DC

## 2021-10-16 NOTE — Progress Notes (Signed)
Christina Dalton, [place]  [address] Phone:  [phone]   Fax:  [phone] Subjective:   Patient ID: Christina Dalton, female    DOB: [birth year], 71 y.o.   MRN: [id number]  Chief Complaint  Patient presents with   Follow-up    Pt is here for 3 month follow up for BP. Pt stated she has been felling dizzy for the past month and a half. Pt need refill on vitamin D   HPI Christina Dalton 71 y.o. female  has a past medical history of Acid reflux (09/2019), Coronary artery disease, Hypertension, and Vitamin D deficiency (09/2019).  To the [facility] for reevaluation of hypertension.   Hypertension: Patient here for follow-up of elevated blood pressure. She is not exercising and is not adherent to low salt diet.  Blood pressure I snot being checked at home. Cardiac symptoms none. Patient denies chest pain, claudication, dyspnea, lower extremity edema, orthopnea, and palpitations.  Cardiovascular risk factors: advanced age (older than 39 for men, 50 for women), diabetes mellitus, hypertension, obesity (BMI >= 30 kg/m2), and sedentary lifestyle. Use of agents associated with hypertension: none. History of target organ damage: none.   Diabetes Mellitus Type II, Initial Visit: Patient here for an initial evaluation of Type 2 diabetes mellitus.  Current symptoms/problems include visual disturbances and have been unchanged. Symptoms have been present for several weeks.  The patient was initially diagnosed with Type 2 diabetes mellitus based on the following criteria:  A1C 7.5.  Known diabetic complications: none Cardiovascular risk factors: advanced age (older than 22 for men, 54 for women), diabetes mellitus, hypertension, and obesity (BMI >= 30 kg/m2) Current diabetic medications include oral agent (monotherapy): metformin (generic).   Eye exam current (within one year): yes Weight trend: increasing steadily Prior visit with dietician: no Current diet: high  fat/ cholesterol Current exercise: no regular exercise  Current monitoring regimen: none   Is She on ACE inhibitor or angiotensin II receptor blocker?  Yes  lisinopril (Prinivil)  Daughter at bedside, states that patient recently returned from [facility] and she is not certain that patient was compliant with diet and/ medications during stay. Was in [facility] for 6 wks. Denies any other complaints today.  Denies any fever. Denies any fatigue, chest pain, shortness of breath, HA or dizziness. Denies any blurred vision, numbness or tingling.   Past Medical History:  Diagnosis Date   Acid reflux 09/2019   Coronary artery disease    Hypertension    Vitamin D deficiency 09/2019    Past Surgical History:  Procedure Laterality Date   CARDIAC CATHETERIZATION      History reviewed. No pertinent family history.  Social History   Socioeconomic History   Marital status: Married    Spouse name: Not on file   Number of children: Not on file   Years of education: Not on file   Highest education level: Not on file  Occupational History   Not on file  Tobacco Use   Smoking status: Never   Smokeless tobacco: Never  Vaping Use   Vaping Use: Never used  Substance and Sexual Activity   Alcohol use: No   Drug use: No   Sexual activity: Not on file  Other Topics Concern   Not on file  Social History Narrative   Not on file   Social Determinants of Health   Financial Resource Strain: Not on file  Food Insecurity: Not on file  Transportation Needs: Not on file  Physical Activity: Not on file  Stress: Not on file  Social Connections: Not on file  Intimate Partner Violence: Not on file    Outpatient Medications Prior to Visit  Medication Sig Dispense Refill   Vitamin D, Ergocalciferol, (DRISDOL) 1.25 MG (50000 UNIT) CAPS capsule Take 1 capsule (50,000 Units total) by mouth every 7 (seven) days. 5 capsule 6   amLODipine (NORVASC) 5 MG tablet Take 1 tablet by mouth once daily 90  tablet 1   famotidine (PEPCID) 20 MG tablet Take 1 tablet (20 mg total) by mouth 2 (two) times daily as needed for heartburn or indigestion. (Patient not taking: Reported on 10/16/2021) 60 tablet 6   No facility-administered medications prior to visit.    No Known Allergies  Review of Systems  Constitutional:  Negative for chills, fever and malaise/fatigue.  Respiratory:  Negative for cough and shortness of breath.   Cardiovascular:  Negative for chest pain, palpitations and leg swelling.  Gastrointestinal:  Negative for abdominal pain, blood in stool, constipation, diarrhea, nausea and vomiting.  Musculoskeletal: Negative.   Skin: Negative.   Neurological: Negative.   Psychiatric/Behavioral:  Negative for depression. The patient is not nervous/anxious.   All other systems reviewed and are negative.     Objective:    Physical Exam Vitals reviewed.  Constitutional:      General: She is not in acute distress.    Appearance: Normal appearance. She is obese.  HENT:     Head: Normocephalic.  Cardiovascular:     Rate and Rhythm: Normal rate and regular rhythm.     Pulses: Normal pulses.     Heart sounds: Normal heart sounds.     Comments: No obvious peripheral edema Pulmonary:     Effort: Pulmonary effort is normal.     Breath sounds: Normal breath sounds.  Skin:    General: Skin is warm and dry.     Capillary Refill: Capillary refill takes less than 2 seconds.  Neurological:     General: No focal deficit present.     Mental Status: She is alert and oriented to person, place, and time.  Psychiatric:        Mood and Affect: Mood normal.        Behavior: Behavior normal.        Thought Content: Thought content normal.        Judgment: Judgment normal.    BP (!) 150/81    Pulse 70    Temp (!) 97.5 F (36.4 C)    Ht _0  (1.575 m)    Wt 210 lb 8 oz (95.5 kg)    SpO2 98%    BMI 38.50 kg/m  Wt Readings from Last 3 Encounters:  10/16/21 210 lb 8 oz (95.5 kg)  05/22/21 207 lb  0.2 oz (93.9 kg)  11/04/19 209 lb 12.8 oz (95.2 kg)    Immunization History  Administered Date(s) Administered   Janssen (J&J) SARS-COV-2 Vaccination 11/08/2019    Diabetic Foot Exam - Simple   No data filed     Lab Results  Component Value Date   TSH 2.420 10/07/2019   Lab Results  Component Value Date   WBC 6.2 05/22/2021   HGB 14.1 05/22/2021   HCT 43.1 05/22/2021   MCV 85 05/22/2021   PLT 266 05/22/2021   Lab Results  Component Value Date   NA 142 05/22/2021   K 4.6 05/22/2021   CO2 23 05/22/2021   GLUCOSE 130 (H) 05/22/2021   BUN 17  05/22/2021   CREATININE 0.69 05/22/2021   BILITOT 0.2 05/22/2021   ALKPHOS 131 (H) 05/22/2021   AST 22 05/22/2021   ALT 26 05/22/2021   PROT 7.3 05/22/2021   ALBUMIN 4.5 05/22/2021   CALCIUM 9.8 05/22/2021   ANIONGAP 9 07/15/2019   EGFR 93 05/22/2021   Lab Results  Component Value Date   CHOL 232 (H) 05/22/2021   CHOL 210 (H) 10/07/2019   Lab Results  Component Value Date   HDL 52 05/22/2021   HDL 53 10/07/2019   Lab Results  Component Value Date   LDLCALC 159 (H) 05/22/2021   LDLCALC 133 (H) 10/07/2019   Lab Results  Component Value Date   TRIG 118 05/22/2021   TRIG 136 10/07/2019   Lab Results  Component Value Date   CHOLHDL 4.5 (H) 05/22/2021   CHOLHDL 4.0 10/07/2019   Lab Results  Component Value Date   HGBA1C 7.6 (A) 10/16/2021   HGBA1C 7.6 10/16/2021   HGBA1C 7.6 (A) 10/16/2021   HGBA1C 7.6 (A) 10/16/2021       Assessment & Plan:   Problem List Items Addressed This Visit   None Visit Diagnoses     Primary hypertension    -  Primary   Relevant Medications   amLODipine (NORVASC) 10 MG tablet   lisinopril (ZESTRIL) 20 MG tablet Encouraged continued diet and exercise efforts  Encouraged continued compliance with medication     Other Relevant Orders   POCT glycosylated hemoglobin (Hb A1C) (Completed): 7.6   Type 2 diabetes mellitus with other specified complication, without long-term  current use of insulin (HCC)       Relevant Medications   lisinopril (ZESTRIL) 20 MG tablet   metFORMIN (GLUCOPHAGE) 500 MG tablet Daughter at bedside, states that she intends to get patient to nutritionist and eye doctor as soon as possible    Hypertension, unspecified type       Relevant Medications   amLODipine (NORVASC) 10 MG tablet   lisinopril (ZESTRIL) 20 MG tablet Encouraged continued diet and exercise efforts  Encouraged continued compliance with medication     Follow up in 3 mths for reevaluation of DM and hypertension,  sooner as needed     I have changed Coraline Vanwyhe's amLODipine. I am also having her start on lisinopril and metFORMIN. Additionally, I am having her maintain her Vitamin D (Ergocalciferol) and famotidine.  Meds ordered this encounter  Medications   amLODipine (NORVASC) 10 MG tablet    Sig: Take 1 tablet (10 mg total) by mouth daily.    Dispense:  30 tablet    Refill:  2   lisinopril (ZESTRIL) 20 MG tablet    Sig: Take 1 tablet (20 mg total) by mouth daily.    Dispense:  30 tablet    Refill:  2   metFORMIN (GLUCOPHAGE) 500 MG tablet    Sig: Take 1 tablet (500 mg total) by mouth 3 (three) times daily before meals.    Dispense:  90 tablet    Refill:  2     Teena Dunk, NP

## 2021-10-16 NOTE — Patient Instructions (Signed)
You were seen today in the [facility] for reevaluation of hypertension. Labs were collected, results will be available via MyChart or, if abnormal, you will be contacted by clinic staff. You were prescribed medications, please take as directed. Please follow up in 3 mths for reevaluation DM and hypertension.

## 2021-10-29 DIAGNOSIS — L03115 Cellulitis of right lower limb: Secondary | ICD-10-CM | POA: Insufficient documentation

## 2021-10-29 DIAGNOSIS — E119 Type 2 diabetes mellitus without complications: Secondary | ICD-10-CM | POA: Insufficient documentation

## 2021-10-29 DIAGNOSIS — L0390 Cellulitis, unspecified: Secondary | ICD-10-CM | POA: Insufficient documentation

## 2021-11-14 ENCOUNTER — Other Ambulatory Visit: Payer: Self-pay

## 2021-11-14 ENCOUNTER — Encounter: Payer: Self-pay | Admitting: Nurse Practitioner

## 2021-11-14 ENCOUNTER — Ambulatory Visit (INDEPENDENT_AMBULATORY_CARE_PROVIDER_SITE_OTHER): Payer: Self-pay | Admitting: Nurse Practitioner

## 2021-11-14 VITALS — BP 147/79 | HR 67 | Temp 98.3°F | Ht 62.0 in | Wt 204.6 lb

## 2021-11-14 DIAGNOSIS — I10 Essential (primary) hypertension: Secondary | ICD-10-CM

## 2021-11-14 DIAGNOSIS — R600 Localized edema: Secondary | ICD-10-CM

## 2021-11-14 DIAGNOSIS — E1169 Type 2 diabetes mellitus with other specified complication: Secondary | ICD-10-CM

## 2021-11-14 DIAGNOSIS — B353 Tinea pedis: Secondary | ICD-10-CM

## 2021-11-14 DIAGNOSIS — R609 Edema, unspecified: Secondary | ICD-10-CM

## 2021-11-14 DIAGNOSIS — L03115 Cellulitis of right lower limb: Secondary | ICD-10-CM

## 2021-11-14 MED ORDER — FUROSEMIDE 20 MG PO TABS: 20.0000 mg | ORAL_TABLET | Freq: Two times a day (BID) | ORAL | 2 refills | Status: AC

## 2021-11-14 MED ORDER — TRULICITY 0.75 MG/0.5ML ~~LOC~~ SOAJ: 0.7500 mg | SUBCUTANEOUS | 2 refills | Status: DC

## 2021-11-14 MED ORDER — MICONAZOLE NITRATE 2 % EX POWD: Freq: Two times a day (BID) | CUTANEOUS | 0 refills | Status: AC

## 2021-11-14 MED ORDER — LOSARTAN POTASSIUM-HCTZ 50-12.5 MG PO TABS: 1.0000 | ORAL_TABLET | Freq: Every day | ORAL | 2 refills | Status: DC

## 2021-11-14 MED ORDER — AMOXICILLIN-POT CLAVULANATE 875-125 MG PO TABS: 1.0000 | ORAL_TABLET | Freq: Two times a day (BID) | ORAL | 0 refills | Status: AC

## 2021-11-14 NOTE — Progress Notes (Signed)
[facility] Patient [facility] [address] [place], [place]  [address] Phone:  [phone]   Fax:  [phone] Subjective:   Patient ID: Christina Dalton, female    DOB: 03-25-1951, 71 y.o.   MRN: [id number]  Chief Complaint  Patient presents with   Hospitalization Follow-up    Pt is here from follow up visit from the hospital.pt was in the hospital for Cellulitis of right leg pt stated rt leg is tender,swelling, pain   HPI Christina Dalton 71 y.o. female  has a past medical history of Acid reflux (09/2019), Coronary artery disease, Hypertension, and Vitamin D deficiency (09/2019). To the [facility] for hospital follow up.   Patient was admitted to the hospital from 3/4-3/7 for cellulitis. Went to the ED on 3/4 for redness, pain and swelling in the RLE, was admitted for cellulitis. Since that time, swelling and redness has improved, but persists. Patient brought in by daughter, who indicates that patient has completed hospital prescribed antibiotics.   Patient also concerned about metformin, which is causing significant abdominal pain and decreased appetite. Requesting change in medication.  Endorses having itching between toes on right foot for several days.   Hypertension: Patient here for follow-up of elevated blood pressure. She is exercising and is adherent to low salt diet.  Blood pressure is well controlled at home. Cardiac symptoms none. Patient denies none.  Cardiovascular risk factors: advanced age (older than 76 for men, 24 for women), diabetes mellitus, dyslipidemia, and hypertension. Use of agents associated with hypertension: none. History of target organ damage: none.  Systolic B/P at home 130-140.   Past Medical History:  Diagnosis Date   Acid reflux 09/2019   Coronary artery disease    Hypertension    Vitamin D deficiency 09/2019    Past Surgical History:  Procedure Laterality Date   CARDIAC CATHETERIZATION     COLON SURGERY      History reviewed. No pertinent  family history.  Social History   Socioeconomic History   Marital status: Married    Spouse name: Not on file   Number of children: Not on file   Years of education: Not on file   Highest education level: Not on file  Occupational History   Not on file  Tobacco Use   Smoking status: Never   Smokeless tobacco: Never  Vaping Use   Vaping Use: Never used  Substance and Sexual Activity   Alcohol use: No   Drug use: No   Sexual activity: Not on file  Other Topics Concern   Not on file  Social History Narrative   Not on file   Social Determinants of Health   Financial Resource Strain: Not on file  Food Insecurity: Not on file  Transportation Needs: Not on file  Physical Activity: Not on file  Stress: Not on file  Social Connections: Not on file  Intimate Partner Violence: Not on file    Outpatient Medications Prior to Visit  Medication Sig Dispense Refill   amLODipine (NORVASC) 10 MG tablet Take 1 tablet (10 mg total) by mouth daily. 30 tablet 2   Vitamin D, Ergocalciferol, (DRISDOL) 1.25 MG (50000 UNIT) CAPS capsule Take 1 capsule (50,000 Units total) by mouth every 7 (seven) days. 5 capsule 6   lisinopril (ZESTRIL) 20 MG tablet Take 1 tablet (20 mg total) by mouth daily. 30 tablet 2   metFORMIN (GLUCOPHAGE) 500 MG tablet Take 1 tablet (500 mg total) by mouth 3 (three) times daily before meals. 90  tablet 2   famotidine (PEPCID) 20 MG tablet Take 1 tablet (20 mg total) by mouth 2 (two) times daily as needed for heartburn or indigestion. (Patient not taking: Reported on 11/14/2021) 60 tablet 6   No facility-administered medications prior to visit.    No Known Allergies  Review of Systems  Constitutional:  Negative for chills, fever and malaise/fatigue.       Decreased appetite  HENT: Negative.    Eyes: Negative.   Respiratory:  Negative for cough and shortness of breath.   Cardiovascular:  Positive for leg swelling. Negative for chest pain and palpitations.   Gastrointestinal:  Positive for abdominal pain. Negative for blood in stool, constipation, diarrhea, nausea and vomiting.  Genitourinary: Negative.   Musculoskeletal: Negative.   Skin:  Positive for itching.  Neurological: Negative.   Psychiatric/Behavioral:  Negative for depression. The patient is not nervous/anxious.   All other systems reviewed and are negative.     Objective:    Physical Exam Constitutional:      General: She is not in acute distress.    Appearance: Normal appearance.  HENT:     Head: Normocephalic.  Cardiovascular:     Rate and Rhythm: Normal rate and regular rhythm.     Pulses: Normal pulses.     Heart sounds: Normal heart sounds.     Comments: +3 pitting edema in the RLE, moderate tenderness with palpation. Pulmonary:     Effort: Pulmonary effort is normal.     Breath sounds: Normal breath sounds.  Skin:    General: Skin is warm and dry.     Capillary Refill: Capillary refill takes less than 2 seconds.  Neurological:     Mental Status: She is alert.  Psychiatric:        Mood and Affect: Mood normal.        Behavior: Behavior normal.        Thought Content: Thought content normal.        Judgment: Judgment normal.    BP (!) 147/79 (BP Location: Left Arm, Patient Position: Sitting)   Pulse 67   Temp 98.3 F (36.8 C)   Ht 5\' 2"  (1.575 m)   Wt 204 lb 9.6 oz (92.8 kg)   SpO2 100%   BMI 37.42 kg/m  Wt Readings from Last 3 Encounters:  11/14/21 204 lb 9.6 oz (92.8 kg)  10/16/21 210 lb 8 oz (95.5 kg)  05/22/21 207 lb 0.2 oz (93.9 kg)    Immunization History  Administered Date(s) Administered   Janssen (J&J) SARS-COV-2 Vaccination 11/08/2019    Diabetic Foot Exam - Simple   No data filed     Lab Results  Component Value Date   TSH 2.420 10/07/2019   Lab Results  Component Value Date   WBC 6.2 05/22/2021   HGB 14.1 05/22/2021   HCT 43.1 05/22/2021   MCV 85 05/22/2021   PLT 266 05/22/2021   Lab Results  Component Value Date    NA 142 05/22/2021   K 4.6 05/22/2021   CO2 23 05/22/2021   GLUCOSE 130 (H) 05/22/2021   BUN 17 05/22/2021   CREATININE 0.69 05/22/2021   BILITOT 0.2 05/22/2021   ALKPHOS 131 (H) 05/22/2021   AST 22 05/22/2021   ALT 26 05/22/2021   PROT 7.3 05/22/2021   ALBUMIN 4.5 05/22/2021   CALCIUM 9.8 05/22/2021   ANIONGAP 9 07/15/2019   EGFR 93 05/22/2021   Lab Results  Component Value Date   CHOL 232 (H) 05/22/2021  CHOL 210 (H) 10/07/2019   Lab Results  Component Value Date   HDL 52 05/22/2021   HDL 53 10/07/2019   Lab Results  Component Value Date   LDLCALC 159 (H) 05/22/2021   LDLCALC 133 (H) 10/07/2019   Lab Results  Component Value Date   TRIG 118 05/22/2021   TRIG 136 10/07/2019   Lab Results  Component Value Date   CHOLHDL 4.5 (H) 05/22/2021   CHOLHDL 4.0 10/07/2019   Lab Results  Component Value Date   HGBA1C 7.6 (A) 10/16/2021   HGBA1C 7.6 10/16/2021   HGBA1C 7.6 (A) 10/16/2021   HGBA1C 7.6 (A) 10/16/2021       Assessment & Plan:   Problem List Items Addressed This Visit   None Visit Diagnoses     Cellulitis of right lower extremity    -  Primary   Relevant Medications   amoxicillin-clavulanate (AUGMENTIN) 875-125 MG tablet, initiated during visit  for residual cellulitis   Peripheral edema       Relevant Medications   losartan-hydrochlorothiazide (HYZAAR) 50-12.5 MG tablet   furosemide (LASIX) 20 MG tablet Encouraged continued diet and exercise efforts  Encouraged continued compliance with medication     Tinea pedis of right foot       Relevant Medications   miconazole (MICOTIN) 2 % powder Discussed non pharmacological methods for management of symptoms Informed to take OTC medications as needed    Type 2 diabetes mellitus with other specified complication, without long-term current use of insulin (HCC)       Relevant Medications   losartan-hydrochlorothiazide (HYZAAR) 50-12.5 MG tablet   Dulaglutide (TRULICITY) 0.75 MG/0.5ML SOPN, medication  initiated during visit  Encouraged continued diet and exercise efforts  Encouraged continued compliance with medication     Hypertension, unspecified type       Relevant Medications   losartan-hydrochlorothiazide (HYZAAR) 50-12.5 MG tablet   furosemide (LASIX) 20 MG tablet Encouraged continued diet and exercise efforts  Encouraged continued compliance with medication     Follow up in 1 wk for reevaluation of cellulitis, sooner as needed     I have discontinued Breiona Reinard's lisinopril and metFORMIN. I am also having her start on losartan-hydrochlorothiazide, furosemide, Trulicity, miconazole, and amoxicillin-clavulanate. Additionally, I am having her maintain her Vitamin D (Ergocalciferol), famotidine, and amLODipine.  Meds ordered this encounter  Medications   losartan-hydrochlorothiazide (HYZAAR) 50-12.5 MG tablet    Sig: Take 1 tablet by mouth daily.    Dispense:  30 tablet    Refill:  2   furosemide (LASIX) 20 MG tablet    Sig: Take 1 tablet (20 mg total) by mouth 2 (two) times daily.    Dispense:  60 tablet    Refill:  2   Dulaglutide (TRULICITY) 0.75 MG/0.5ML SOPN    Sig: Inject 0.75 mg into the skin once a week.    Dispense:  2 mL    Refill:  2   miconazole (MICOTIN) 2 % powder    Sig: Apply topically in the morning and at bedtime for 14 days.    Dispense:  71 g    Refill:  0   amoxicillin-clavulanate (AUGMENTIN) 875-125 MG tablet    Sig: Take 1 tablet by mouth 2 (two) times daily for 10 days.    Dispense:  20 tablet    Refill:  0     Kathrynn Speed, NP

## 2021-11-14 NOTE — Patient Instructions (Signed)
You were seen today in the [facility] for hospital follow up and chronic illness. Labs were collected, results will be available via MyChart or, if abnormal, you will be contacted by clinic staff. You were prescribed medications, please take as directed. Please follow up in 1 wk for reevaluation for cellulitis.  ?

## 2021-11-21 ENCOUNTER — Ambulatory Visit (INDEPENDENT_AMBULATORY_CARE_PROVIDER_SITE_OTHER): Payer: Self-pay | Admitting: Nurse Practitioner

## 2021-11-21 ENCOUNTER — Other Ambulatory Visit: Payer: Self-pay

## 2021-11-21 VITALS — BP 119/66 | HR 85 | Temp 98.0°F | Ht 62.0 in | Wt 199.5 lb

## 2021-11-21 DIAGNOSIS — L03115 Cellulitis of right lower limb: Secondary | ICD-10-CM

## 2021-11-21 DIAGNOSIS — E1169 Type 2 diabetes mellitus with other specified complication: Secondary | ICD-10-CM

## 2021-11-21 DIAGNOSIS — Z599 Problem related to housing and economic circumstances, unspecified: Secondary | ICD-10-CM

## 2021-11-21 MED ORDER — METFORMIN HCL 500 MG PO TABS: 500.0000 mg | ORAL_TABLET | Freq: Three times a day (TID) | ORAL | 2 refills | Status: AC

## 2021-11-21 MED ORDER — GLIPIZIDE 5 MG PO TABS: 5.0000 mg | ORAL_TABLET | Freq: Two times a day (BID) | ORAL | 3 refills | Status: DC

## 2021-11-21 MED ORDER — CEPHALEXIN 500 MG PO CAPS: 500.0000 mg | ORAL_CAPSULE | Freq: Four times a day (QID) | ORAL | 0 refills | Status: AC

## 2021-11-21 NOTE — Progress Notes (Signed)
[facility] ?Case Management Referral Note ? ?11/21/2021 ?Name: Christina Dalton MRN: [id number] DOB: 1951-04-29 ?[name] is a 71 y.o. year old female who sees Pass[place], Jake Church I, NP for primary care. LCSW was consulted to assess patient's needs and assist the patient with Financial Difficulties related to lack of health coverage . ? ?Interpreter: Yes.     Interpreter Name & Language: Arabic ? ?Assessment: Patient experiencing  lack of health coverage . She needs assistance with her medication for diabetes. ? ?Intervention: Met with patient and daughter during PCP visit. Patient was unable to afford Trulicity so hasn't filled that prescription. Reviewed [employer] and [facility] financial assistance applications with patient and daughter. Patient not eligible for [employer] due to living in [facility]. Advised patient and daughter of supporting documents to be submitted with applications and advised to call CSW with additional questions and for assistance in submitting applications.  ? ?SDOH (Social Determinants of Health) assessments performed: No ? ?Review of patient status, including review of consultants reports, relevant laboratory and other test results, and collaboration with appropriate care team members and the patient's provider was performed as part of comprehensive patient evaluation and provision of services.   ? ?Estanislado Emms, LCSW ?Patient [place] ?[place] ?[phone] ?  ? ?

## 2021-11-21 NOTE — Patient Instructions (Signed)
You were seen today in the [facility] for reevaluation of cellulitis. You were prescribed medications, please take as directed. Please follow up in 3 mths for reevaluation of DM. ?

## 2021-11-21 NOTE — Progress Notes (Signed)
? ?[place] [place] ?[place], [place]  [address] ?Phone:  [phone]   Fax:  [phone] ?Subjective:  ? Patient ID: Christina Dalton, female    DOB: 07/13/1951, 71 y.o.   MRN: [id number] ? ?Chief Complaint  ?Patient presents with  ? Follow-up  ?  Pt is here for follow up visit for cellulitis. Pt stated right leg feels like its on fire. Pt still has a cough pt stated trulicity is expensive would like a more affordable   ? ?HPI ?Christina Dalton 71 y.o. female  has a past medical history of Acid reflux (09/2019), Coronary artery disease, Hypertension, and Vitamin D deficiency (09/2019). To the [facility] for reevaluation of cellulitis.  ? ?Diabetes Mellitus: Patient presents for follow up of diabetes. Symptoms: none. Denies any symptoms. Patient denies foot ulcerations, hyperglycemia, hypoglycemia , and polydipsia.  Evaluation to date has been included: hemoglobin A1C.  Home sugars: BGs range between 120 and 128 . Treatment to date have been metformin and lifestyle changes, which have been somewhat effective. Patient has not taken previously prescribed Trulicity due to cost, has been taking metformin twice per day and managing diet. Patient has also been practicing Ramadan and fasting intermittently.  ? ?Denies any pain from previously treated cellulitis and swelling has improved. Patient has been compliant with all medications and wrapping leg with ace wrap. Redness has also improved. Denies any other concerns today.  ? ?Denies any fatigue, chest pain, shortness of breath, HA or dizziness. Denies any blurred vision, numbness or tingling. ? ?Past Medical History:  ?Diagnosis Date  ? Acid reflux 09/2019  ? Coronary artery disease   ? Hypertension   ? Vitamin D deficiency 09/2019  ? ? ?Past Surgical History:  ?Procedure Laterality Date  ? CARDIAC CATHETERIZATION    ? COLON SURGERY    ? ? ?No family history on file. ? ?Social History  ? ?Socioeconomic History  ? Marital status: Married  ?   Spouse name: Not on file  ? Number of children: Not on file  ? Years of education: Not on file  ? Highest education level: Not on file  ?Occupational History  ? Not on file  ?Tobacco Use  ? Smoking status: Never  ? Smokeless tobacco: Never  ?Vaping Use  ? Vaping Use: Never used  ?Substance and Sexual Activity  ? Alcohol use: No  ? Drug use: No  ? Sexual activity: Not on file  ?Other Topics Concern  ? Not on file  ?Social History Narrative  ? Not on file  ? ?Social Determinants of Health  ? ?Financial Resource Strain: Not on file  ?Food Insecurity: Not on file  ?Transportation Needs: Not on file  ?Physical Activity: Not on file  ?Stress: Not on file  ?Social Connections: Not on file  ?Intimate Partner Violence: Not on file  ? ? ?Outpatient Medications Prior to Visit  ?Medication Sig Dispense Refill  ? amLODipine (NORVASC) 10 MG tablet Take 1 tablet (10 mg total) by mouth daily. 30 tablet 2  ? famotidine (PEPCID) 20 MG tablet Take 1 tablet (20 mg total) by mouth 2 (two) times daily as needed for heartburn or indigestion. 60 tablet 6  ? furosemide (LASIX) 20 MG tablet Take 1 tablet (20 mg total) by mouth 2 (two) times daily. 60 tablet 2  ? losartan-hydrochlorothiazide (HYZAAR) 50-12.5 MG tablet Take 1 tablet by mouth daily. 30 tablet 2  ? miconazole (MICOTIN) 2 % powder Apply topically in the morning and at  bedtime for 14 days. 71 g 0  ? Vitamin D, Ergocalciferol, (DRISDOL) 1.25 MG (50000 UNIT) CAPS capsule Take 1 capsule (50,000 Units total) by mouth every 7 (seven) days. 5 capsule 6  ? Dulaglutide (TRULICITY) 4.40 HK/7.4QV SOPN Inject 0.75 mg into the skin once a week. 2 mL 2  ? amoxicillin-clavulanate (AUGMENTIN) 875-125 MG tablet Take 1 tablet by mouth 2 (two) times daily for 10 days. 20 tablet 0  ? ?No facility-administered medications prior to visit.  ? ? ?No Known Allergies ? ?Review of Systems  ?Constitutional:  Negative for chills, fever and malaise/fatigue.  ?Respiratory:  Negative for cough and shortness of  breath.   ?Cardiovascular:  Negative for chest pain, palpitations and leg swelling.  ?Gastrointestinal:  Negative for abdominal pain, blood in stool, constipation, diarrhea, nausea and vomiting.  ?Musculoskeletal: Negative.   ?Skin: Negative.   ?     See HPI  ?Neurological: Negative.   ?Psychiatric/Behavioral:  Negative for depression. The patient is not nervous/anxious.   ?All other systems reviewed and are negative. ? ?   ?Objective:  ?  ?Physical Exam ?Constitutional:   ?   General: She is not in acute distress. ?   Appearance: Normal appearance. She is obese.  ?HENT:  ?   Head: Normocephalic.  ?Neck:  ?   Vascular: No carotid bruit.  ?Cardiovascular:  ?   Rate and Rhythm: Normal rate and regular rhythm.  ?   Pulses: Normal pulses.  ?   Heart sounds: Normal heart sounds.  ?   Comments: No obvious peripheral edema ?Pulmonary:  ?   Effort: Pulmonary effort is normal.  ?   Breath sounds: Normal breath sounds.  ?Musculoskeletal:     ?   General: No swelling, tenderness, deformity or signs of injury. Normal range of motion.  ?   Cervical back: Normal range of motion and neck supple. No rigidity or tenderness.  ?   Right lower leg: No edema.  ?   Left lower leg: No edema.  ?Lymphadenopathy:  ?   Cervical: No cervical adenopathy.  ?Skin: ?   General: Skin is warm and dry.  ?   Capillary Refill: Capillary refill takes less than 2 seconds.  ?   Findings: Erythema present.  ?   Comments: Erythema noted to right shin, has improved since last visit. Swelling has resolved. Area is non tender to palpation.   ?Neurological:  ?   General: No focal deficit present.  ?   Mental Status: She is alert and oriented to person, place, and time.  ?Psychiatric:     ?   Mood and Affect: Mood normal.     ?   Behavior: Behavior normal.     ?   Thought Content: Thought content normal.     ?   Judgment: Judgment normal.  ? ? ?BP 119/66 (BP Location: Right Arm, Patient Position: Sitting, Cuff Size: Large)   Pulse 85   Temp 98 ?F (36.7 ?C)    Ht $R'5\' 2"'Ib$  (1.575 m)   Wt 199 lb 8 oz (90.5 kg)   SpO2 100%   BMI 36.49 kg/m?  ?Wt Readings from Last 3 Encounters:  ?11/21/21 199 lb 8 oz (90.5 kg)  ?11/14/21 204 lb 9.6 oz (92.8 kg)  ?10/16/21 210 lb 8 oz (95.5 kg)  ? ? ?Immunization History  ?Administered Date(s) Administered  ? Janssen (J&J) SARS-COV-2 Vaccination 11/08/2019  ? ? ?Diabetic Foot Exam - Simple   ?No data filed ?  ? ? ?  Lab Results  ?Component Value Date  ? TSH 2.420 10/07/2019  ? ?Lab Results  ?Component Value Date  ? WBC 6.2 05/22/2021  ? HGB 14.1 05/22/2021  ? HCT 43.1 05/22/2021  ? MCV 85 05/22/2021  ? PLT 266 05/22/2021  ? ?Lab Results  ?Component Value Date  ? NA 142 05/22/2021  ? K 4.6 05/22/2021  ? CO2 23 05/22/2021  ? GLUCOSE 130 (H) 05/22/2021  ? BUN 17 05/22/2021  ? CREATININE 0.69 05/22/2021  ? BILITOT 0.2 05/22/2021  ? ALKPHOS 131 (H) 05/22/2021  ? AST 22 05/22/2021  ? ALT 26 05/22/2021  ? PROT 7.3 05/22/2021  ? ALBUMIN 4.5 05/22/2021  ? CALCIUM 9.8 05/22/2021  ? ANIONGAP 9 07/15/2019  ? EGFR 93 05/22/2021  ? ?Lab Results  ?Component Value Date  ? CHOL 232 (H) 05/22/2021  ? CHOL 210 (H) 10/07/2019  ? ?Lab Results  ?Component Value Date  ? HDL 52 05/22/2021  ? HDL 53 10/07/2019  ? ?Lab Results  ?Component Value Date  ? LDLCALC 159 (H) 05/22/2021  ? LDLCALC 133 (H) 10/07/2019  ? ?Lab Results  ?Component Value Date  ? TRIG 118 05/22/2021  ? TRIG 136 10/07/2019  ? ?Lab Results  ?Component Value Date  ? CHOLHDL 4.5 (H) 05/22/2021  ? CHOLHDL 4.0 10/07/2019  ? ?Lab Results  ?Component Value Date  ? HGBA1C 7.6 (A) 10/16/2021  ? HGBA1C 7.6 10/16/2021  ? HGBA1C 7.6 (A) 10/16/2021  ? HGBA1C 7.6 (A) 10/16/2021  ? ? ?   ?Assessment & Plan:  ? ?Problem List Items Addressed This Visit   ?None ?Visit Diagnoses   ? ? Cellulitis of right lower extremity    -  Primary  ? Relevant Medications  ? cephALEXin (KEFLEX) 500 MG capsule, initiated during visit. Informed to start after completion of Augmentin.  ?Given anticipatory guidance  ?Encouraged to take  medications as prescribed   ? Type 2 diabetes mellitus with other specified complication, without long-term current use of insulin ([place])      ? Relevant Medications  ? metFORMIN (GLUCOPHAGE) 500 MG tablet, rest

## 2021-11-22 ENCOUNTER — Encounter: Payer: Self-pay | Admitting: Nurse Practitioner

## 2022-01-09 ENCOUNTER — Ambulatory Visit (INDEPENDENT_AMBULATORY_CARE_PROVIDER_SITE_OTHER): Payer: Self-pay | Admitting: Family Medicine

## 2022-01-09 ENCOUNTER — Encounter: Payer: Self-pay | Admitting: Family Medicine

## 2022-01-09 VITALS — BP 132/78 | HR 75 | Temp 97.7°F | Ht 62.0 in | Wt 200.0 lb

## 2022-01-09 DIAGNOSIS — M79604 Pain in right leg: Secondary | ICD-10-CM

## 2022-01-09 DIAGNOSIS — E1169 Type 2 diabetes mellitus with other specified complication: Secondary | ICD-10-CM | POA: Insufficient documentation

## 2022-01-09 DIAGNOSIS — I152 Hypertension secondary to endocrine disorders: Secondary | ICD-10-CM

## 2022-01-09 DIAGNOSIS — Z789 Other specified health status: Secondary | ICD-10-CM

## 2022-01-09 DIAGNOSIS — R252 Cramp and spasm: Secondary | ICD-10-CM | POA: Insufficient documentation

## 2022-01-09 DIAGNOSIS — E559 Vitamin D deficiency, unspecified: Secondary | ICD-10-CM

## 2022-01-09 DIAGNOSIS — E1159 Type 2 diabetes mellitus with other circulatory complications: Secondary | ICD-10-CM

## 2022-01-09 DIAGNOSIS — E785 Hyperlipidemia, unspecified: Secondary | ICD-10-CM

## 2022-01-09 DIAGNOSIS — M79605 Pain in left leg: Secondary | ICD-10-CM

## 2022-01-09 LAB — COMPREHENSIVE METABOLIC PANEL
ALT: 19 U/L (ref 0–35)
AST: 20 U/L (ref 0–37)
Albumin: 4.2 g/dL (ref 3.5–5.2)
Alkaline Phosphatase: 74 U/L (ref 39–117)
BUN: 18 mg/dL (ref 6–23)
CO2: 25 mEq/L (ref 19–32)
Calcium: 10 mg/dL (ref 8.4–10.5)
Chloride: 104 mEq/L (ref 96–112)
Creatinine, Ser: 0.78 mg/dL (ref 0.40–1.20)
GFR: 75.91 mL/min (ref >60.00)
Glucose, Bld: 128 mg/dL — ABNORMAL HIGH (ref 70–99)
Potassium: 3.9 mEq/L (ref 3.5–5.1)
Sodium: 138 mEq/L (ref 135–145)
Total Bilirubin: 0.3 mg/dL (ref 0.2–1.2)
Total Protein: 7.7 g/dL (ref 6.0–8.3)

## 2022-01-09 LAB — CBC WITH DIFFERENTIAL/PLATELET
Basophils Absolute: 0 10*3/uL (ref 0.0–0.1)
Basophils Relative: 0.5 % (ref 0.0–3.0)
Eosinophils Absolute: 0.2 10*3/uL (ref 0.0–0.7)
Eosinophils Relative: 3.4 % (ref 0.0–5.0)
HCT: 37.2 % (ref 36.0–46.0)
Hemoglobin: 12.6 g/dL (ref 12.0–15.0)
Lymphocytes Relative: 35.8 % (ref 12.0–46.0)
Lymphs Abs: 2.5 10*3/uL (ref 0.7–4.0)
MCHC: 33.8 g/dL (ref 30.0–36.0)
MCV: 86.7 fl (ref 78.0–100.0)
Monocytes Absolute: 0.6 10*3/uL (ref 0.1–1.0)
Monocytes Relative: 8.8 % (ref 3.0–12.0)
Neutro Abs: 3.6 10*3/uL (ref 1.4–7.7)
Neutrophils Relative %: 51.5 % (ref 43.0–77.0)
Platelets: 264 10*3/uL (ref 150.0–400.0)
RBC: 4.29 Mil/uL (ref 3.87–5.11)
RDW: 14.9 % (ref 11.5–15.5)
WBC: 7 10*3/uL (ref 4.0–10.5)

## 2022-01-09 LAB — LIPID PANEL
Cholesterol: 176 mg/dL (ref 0–200)
HDL: 47.6 mg/dL (ref >39.00)
LDL Cholesterol: 99 mg/dL (ref 0–99)
NonHDL: 128.84
Total CHOL/HDL Ratio: 4
Triglycerides: 148 mg/dL (ref 0.0–149.0)
VLDL: 29.6 mg/dL (ref 0.0–40.0)

## 2022-01-09 LAB — VITAMIN D 25 HYDROXY (VIT D DEFICIENCY, FRACTURES): VITD: 33.61 ng/mL (ref 30.00–100.00)

## 2022-01-09 LAB — VITAMIN B12: Vitamin B-12: 338 pg/mL (ref 211–911)

## 2022-01-09 LAB — MAGNESIUM: Magnesium: 1.8 mg/dL (ref 1.5–2.5)

## 2022-01-09 LAB — TSH: TSH: 3.7 u[IU]/mL (ref 0.35–5.50)

## 2022-01-09 NOTE — Patient Instructions (Signed)
?Please go downstairs to get blood blood work before you leave today. ? ?Continue taking metformin 3 times daily with meals.  Avoid fasting. ?Do not start glipizide ? ?Continue checking your blood sugars daily.  Bring in your daily readings to your next visit. ? ?Continue other medications ? ?You will receive a call from [facility] to schedule blood flow testing in your legs. ? ?Follow-up with me in 4 to 6 weeks. ? ???? ?????? ???????? ??? ???????? ?Diabetes Mellitus and Nutrition, Adult ?????? ????? ?? ?????? ?? ??? ??????? ???? ?? ??????? ???? ?? ???? ????? ????? ???? ???? ??? ??????? ??? ???? (????????) ????? ???? ???? ??? ????? ??????. ?? ????? ??????? ?????? ?????? ?????? ?? ???????? ????? ??????? ?? ???? ?????? ???: ? ??????? ??? ???? ???????? ?? ????. ? ????? ????? ??????? ?????? ?????. ? ????? ??? ????. ? ?????? ??? ????? ????? ??????? ????. ??? ???? ???? ?? ???? ??? ??? ??????? ?????? ??? ?????? ?? ??? ????? ?????? ?? ????? ???? ??? ???????? ?????? ??????? ???? ???????. ??? ?? ???? ????? ??????? ?????? ?? ?????? ?? ?????? ???? ?????? ???? ??? ????? ?????? ????? ?? ????. ??? ????? ??? ?????? ????? ?????? ???: ? ??? ??????? ???????? ???? ??????. ? ??????? ???? ????????. ? ????. ? ???? ?????? ???? ???? ???? ????????????. ? ????? ?????? ???? ??????. ? ??????? ?????? ?????? ???? ????? ????? ??? ??? ????? ?? ??? ??????. ???? ???? ???????????? ????? ????? ???????????? ??? ????? ???????? ????? ???? ?? ?? ??? ??? ?? ??????? ????????. ?????? ?? ????? ???????????? ???? ???? ???????? ?? ????. ???? ?? ????? ?? ???? ???? ???????????? ???? ???? ?? ???????? ????? ?? ?? ????. ????? ??? ??????? ??????? ??? ???. ????? ?? ?????? ??????? ??????? ?? ???? ???? ???????????? ???? ????? ???? ??????? ?? ?? ???? ????? ?? ???? ?????. ???? ???? ??????? ????????? ????? ?????????? ???????? ???? ?? ???? ???? ?????? ???? (??? ??? ????)? ????? ??? ??? ?????? ????????? ?? ????? ????? ?????? ???????? ?? ???? ????. ???? ??? ????  ???? ????? ?? ???? ??????. ?? ????? ??? ??? ???? ???????? ??????? ??????? ?????? ???? ????? ??????? ?? ????? ?????????. ? ?? ?????? ????????? ??????? ?? ??????? ???????: ?? ??? ????? ???? ??????? ?????? ??????? ?????? ???? ????? ?????????.  ?? ??? ???? ??????? ?? ?????? ?????? ?? ??????? ????? ?? ?????? ??????. ? ??? ??? ?????? ????????? ????????: ?? ??? ???? ??????? ???? ???????? ??? ????? ??????: ?? ????? ???? ??? ???? ??????.  ?? ??????? ??? ???? ??????. ?? ???? ???? ?????? ?? ?? ????? ???????. ?? ???????? ???????? ????? ??????? ?????? ???? ??? 12 ????? (355 ??) ?? ?????? ?? ??? ??? 5 ?????? (148 ??) ?? ?????? ?? ??? ??? ????? ???? (44 ??) ?? ????????? ???????? ??????. ?? ???? ?????? ?? ???? ?????? ??? ????? ?? ?????? ???? ?? ????? ??? ????? ?????? ?? ???????? ??? ???????. ?? ?????? ?? ?????? ???????? ????????? ?????????? ???????? ?????? ?? ????? ??? ???? ????? ?? ????? ???? ?? ????? ??? ????????????. ??? ??????? ??????? ???? ?????? ?? ????? ??? ?????? ? ?????? ?????? ????????? ???????? ? ???? ??????? ?? ????? ??????? ??? ???? ????????? ???????? ("Nutrition Facts") ??? ??????? ?????????? ???????. ?????? ?? ??? ??????? ???????? ?????? ???????????? ??????? ???????? ???????? ?????? ?????? ??? ?????? ???? ??? ?? ???? ???? ?? ????? ??????? ??????. ?? ???? ?????? ?? ???????? ????? ?????? ??????? ??? ???? ?? ????? ?????. ? ???? ?? ?????? ???????????? (?????????) ?? ?? ??? ????. ? ???? ?? ??? ?????? ?????? ??????? ??????? ???????? ?? ????? ???????. ????? ??????? ???? ????? ??? ???? ????? ?? ??? ?????? ?? ??????? ????. ? ???? ?? ??? ????????? (????) ??? ?????? (????????) ?? ????? ??????? ??????. ????? ??? ???? ??????? ????? ???? ???????? ????? ??? ??  2,300 ??????? ?? ?????. ? ???? ?????? ??? ?????? ??????? ?????? ???????? ??? ??????? ?????? ????? "?????? ??????" "low-fat" ?? "?? ????? ??? ????" ?? "????? ?? ??????" "nonfat". ???? ??????? ?? ????? ??? ????? ???? ?? ????? ?????? ?? ???????????? ??????? ???? ??????. ? ????  ??? ??????? ????? ???????? ?????? ??????? ??????? ?? ?????? ???????? ??????? ??? ??????. ??????? ? ???? ????? ??????? ??????? ?? ?????? ?????? ?? ??????? ????????. ???? ??????? ????? ?????? ??? ????? ????? ?? ????????? ?????? ?????? ???????. ? ????? ?? ?????? ??????? ?? ???? ??????? ????????. ???? ?? ?????? ???? ???? ??? ?????? ??????? ?????????? ???????? ??????? ???????? ??????? ???????? ??????? ??????? ???????. ?????? ? ?????? ?????? ????? ??? ????? ??????? ??? ?????? ????? ?? ????? ??? ????? ????? ??????? ??? ????? ??????. ? ?????? ?????? ?????? ?? ????? ??? ???? ??????? ?? ???????? ?? ????? ?????. ? ???? ????? ??????? ?? ??????? ?? ?????? ????? ??????. ?????? ??????? ? ????? ??????? ???????? ???????? ??????? ???????? ????? ?? ????? ???? ?? ???. ???? ?????? ???? ????? ??? ????? ???? ???? ????? ?????. ? ????? ??????? ?????? ???????? ??? ??????? ??????? ????????? ?????????? ??????? ???????. ? ????? 4-6 ?????? (112-168 ????) ?? ?????????? ????? ?????? ?? ??? ??? ??????? ????? ?????? ?? ?????? ??????? ????????? ?? ?????? ?? ??????. ???????? (???????) (?????? 28 ??) ??????? ?? ?????????? ????? ?????? ??????: ?? ????? ????? (28 ????) ?? ?????? ?? ??????? ?? ???????. ?? ???? ?????. ?? ? ??? (62 ??????) ?? ??????. ? ????? ?????? ??? ??????? ???? ????? ??? ???? ????? ??? ????????? ????????? ??????? ????????. ??? ??????? ???? ??? ?? ????????? ???????? ??????. ??????. ????????. ?????. ??????. ???????. ?????. ???????. ????????. ??????. ??????. ?????. ?????????? ?????????? ???????? ??? ?? ??? ???? ???????? ??????? ?? ???????? ?????? ?? ???? ?????? ??????? ?????? ???????? ???????. ???????. ????????. ????????. ?????. ????? ??????. ???????. ??????. ?????. ??????. ??????? ???????? (???? ??????). ??????? ??????? ??????? ??? ????? ?????? ?? ??????? ?????? ??????? ?????????? ?????????? ??????? ?????????. ??????? ??? ???????. ???????. ????? ????? ?? ?????. ??????? ??????????? ?????? ?????????? ???????. ??????? ???? ?????. ??? ???????  ?????? ??????. ??????. ????????. ??????. ??????? ??????? ??????? ??????? ?????? ????? ?? ?????? ????? ??? ?????? ???????? ??????. ???????? ???????? ????? ?? ?? ???? ??????? ??????? ?? ????????? ?????????? ???? ????? ???????. ??? ????? ??????? ??????? ?????? ?????? ?? ?????????. ??? ??????? ?????? ??????? ???????? ???????? ??????? ??? ???????. ?????????? ?????????? ???????. ???????? ??????? ??????? ???? ?????? ?? ???? ???????. ??????? ??????? ?????? ?????? ?????? ??????? ?????? ??? ?????? ?????????? ???????? ??????? ???????. ???? ???? ??????? ????????. ??????? ??????????? ?????? ???? ????? ?????? ??????. ??????? ?? ??????. ????? ?????? ?????? ?? ??????. ?????? ???????. ???? ?????? ???????. ??????? ??????? ?????? ?? ??? ?? ???? ???? ?????. ?????????? ?????????? ???????? ??? ????? ?????? ?? ??????. ????? ?? ???? ??????? ??????? ????? ??????? ??????? ?? ????????? ?????????? ???? ????? ???? ??????. ??? ????? ??????? ??????? ?????? ?????? ?? ?????????. ?????? ????? ????? ??? ??????? ??????? ?????? ??????? ?? ? ?? ????? ??? ??? ???? ?? ??????? ????? ?????? ???? ?????? ????????? ? ?? ????? ??? ??????? ??????? ?????? ? ?? ??? ?????? ???? ?????? ??????? ?? ???? ?? ??? ?????? ? ?? ???? ??? ???? ?????? ????? ?????? ?????? ??? ?????? ?? ?????????: ? ??????? ????????? ?????? (American Diabetes Association):? [email or web address] ? ???????? ??????? ???????? ???????? (Academy of Nutrition and Dietetics):? [email or web address] ? ?????? ?????? ?????? ?????? ?????? ?????? ?????? [employer] of Diabetes and Digestive and Kidney Diseases):? [email or web address] ? ????? ?????? ????? ???? ?????? ???????? (Association of Diabetes Care & Education Specialists):? [email or web address] ????? ? ?? ??????? ???? ?? ???? ????? ????? ???? ???? ??? ??????? ??? ???? (????????) ????? ???? ???? ??? ????? ??????. ??? ??????? ??? ?????? ????????? ???????? ??? ????. ? ??? ??? ??????? ?????? ??????? ??? ??????? ??? ???? ???????? ?? ???? ????? ?? ???????? ??????? ??????  ?????. ? ??? ?? ???? ????? ??????? ?????? ?? ?????? ?? ?????? ???? ?????? ???? ??? ????? ?????? ????? ?? ????. ???? ????? ?? ??? ????????? ?? ???? ?????? ????????? ???? ?????? ???? ??????? ??????. ???? ?

## 2022-01-09 NOTE — Assessment & Plan Note (Signed)
Hgb A1c 7.8% approx 2 months ago. Continue metformin tid and do not start on glipizide. She will report back any low readings. Avoid fasting while taking metformin.  ?Follow up in 4 weeks.  ?

## 2022-01-09 NOTE — Assessment & Plan Note (Signed)
She is not currently taking a statin. She does not recall taking one in the past. Check lipids and start on statin therapy.  ?

## 2022-01-09 NOTE — Assessment & Plan Note (Signed)
BP controlled. Continue current medications. Check labs and follow up ?

## 2022-01-09 NOTE — Assessment & Plan Note (Signed)
Continue vitamin D supplement and adjust dose as needed pending serum vitamin D today ?

## 2022-01-09 NOTE — Progress Notes (Signed)
? ?Subjective:  ? ? ? Patient ID: Christina Dalton, female    DOB: 1950/12/30, 71 y.o.   MRN: [id number] ? ?Chief Complaint  ?Patient presents with  ? Establish Care  ?  MRSA a few years ago in leg, since then has been having leg pain. Hospitaled in March for cellulitis. Borderline diabetic and is against taking insulin so would like options for that.   ? ? ?HPI ?Patient is in today for a new patient visit. Lives in [place] during part of the year.  ?An interpreter is with her today but does not speak the same dialect. Patient's granddaughter is doing quite well interpreting and the patient would prefer to let her continue the visit. The interpreter was dismissed early into the visit.  ? ?Diabetes- last Hgb A1c 7.8% on 10/29/2021  ?Taking metformin 500 mg tid with meals.  ?She checks her blood sugars at home with range between 78-140s.  ?She fasts some days and still takes her metformin.  ? ?She did not pick up -Glipizide 5 mg which was prescribed bid with meals.  ? ?Trulicity was not affordable.  ? ?She is taking several medications for HTN and denies concerns.  ? ?Taking Zyrtec for allergies.  ? ?Taking vitamin D for vitamin D def ? ? ?Hx of cellulitis of RLE. She reports bilateral LE pain at rest and mainly at night. She reports leg cramps 2x per week.  ?Denies claudication. No numbness, tingling or weakness.  ? ?Denies fever, chills, dizziness, chest pain, palpitations, shortness of breath, abdominal pain, N/V/D, urinary symptoms, LE edema.  ? ? ? ? ?Health Maintenance Due  ?Topic Date Due  ? OPHTHALMOLOGY EXAM  Never done  ? Zoster Vaccines- Shingrix (1 of 2) Never done  ? Pneumonia Vaccine 30+ Years old (1 - PCV) Never done  ? ? ?Past Medical History:  ?Diagnosis Date  ? Acid reflux 09/2019  ? Coronary artery disease   ? Hypertension   ? Vitamin D deficiency 09/2019  ? ? ?Past Surgical History:  ?Procedure Laterality Date  ? CARDIAC CATHETERIZATION    ? COLON SURGERY    ? ? ?History reviewed. No pertinent family  history. ? ?Social History  ? ?Socioeconomic History  ? Marital status: Married  ?  Spouse name: Not on file  ? Number of children: Not on file  ? Years of education: Not on file  ? Highest education level: Not on file  ?Occupational History  ? Not on file  ?Tobacco Use  ? Smoking status: Never  ? Smokeless tobacco: Never  ?Vaping Use  ? Vaping Use: Never used  ?Substance and Sexual Activity  ? Alcohol use: No  ? Drug use: No  ? Sexual activity: Not on file  ?Other Topics Concern  ? Not on file  ?Social History Narrative  ? Not on file  ? ?Social Determinants of Health  ? ?Financial Resource Strain: Not on file  ?Food Insecurity: Not on file  ?Transportation Needs: Not on file  ?Physical Activity: Not on file  ?Stress: Not on file  ?Social Connections: Not on file  ?Intimate Partner Violence: Not on file  ? ? ?Outpatient Medications Prior to Visit  ?Medication Sig Dispense Refill  ? amLODipine (NORVASC) 10 MG tablet Take 1 tablet (10 mg total) by mouth daily. 30 tablet 2  ? famotidine (PEPCID) 20 MG tablet Take 1 tablet (20 mg total) by mouth 2 (two) times daily as needed for heartburn or indigestion. 60 tablet 6  ? furosemide (  LASIX) 20 MG tablet Take 1 tablet (20 mg total) by mouth 2 (two) times daily. 60 tablet 2  ? glipiZIDE (GLUCOTROL) 5 MG tablet Take 1 tablet (5 mg total) by mouth 2 (two) times daily before a meal. 60 tablet 3  ? losartan-hydrochlorothiazide (HYZAAR) 50-12.5 MG tablet Take 1 tablet by mouth daily. 30 tablet 2  ? Magnesium Citrate 200 MG TABS Take by mouth. Take 2 tablets (400 mg) once daily.    ? metFORMIN (GLUCOPHAGE) 500 MG tablet Take 1 tablet (500 mg total) by mouth 3 (three) times daily before meals. 90 tablet 2  ? Multiple Vitamins-Minerals (MULTIVITAMIN WOMEN PO) Take by mouth.    ? Vitamin D, Ergocalciferol, (DRISDOL) 1.25 MG (50000 UNIT) CAPS capsule Take 1 capsule (50,000 Units total) by mouth every 7 (seven) days. 5 capsule 6  ? ?No facility-administered medications prior to visit.   ? ? ?No Known Allergies ? ?ROS ?Pertinent positives and negatives in the history of present illness. ? ?   ?Objective:  ?  ?Physical Exam ?Constitutional:   ?   General: She is not in acute distress. ?   Appearance: She is not ill-appearing.  ?Eyes:  ?   Conjunctiva/sclera: Conjunctivae normal.  ?   Pupils: Pupils are equal, round, and reactive to light.  ?Cardiovascular:  ?   Rate and Rhythm: Normal rate and regular rhythm.  ?   Pulses: Normal pulses.  ?Pulmonary:  ?   Effort: Pulmonary effort is normal.  ?   Breath sounds: Normal breath sounds.  ?Musculoskeletal:     ?   General: Normal range of motion.  ?   Right lower leg: No edema.  ?   Left lower leg: No edema.  ?Skin: ?   General: Skin is warm and dry.  ?   Capillary Refill: Capillary refill takes less than 2 seconds.  ?Neurological:  ?   General: No focal deficit present.  ?   Mental Status: She is alert and oriented to person, place, and time.  ?   Gait: Gait normal.  ?Psychiatric:     ?   Mood and Affect: Mood normal.     ?   Behavior: Behavior normal.  ? ? ?BP 132/78 (BP Location: Left Arm, Patient Position: Sitting, Cuff Size: Large)   Pulse 75   Temp 97.7 ?F (36.5 ?C) (Temporal)   Ht 5\' 2"  (1.575 m)   Wt 200 lb (90.7 kg)   SpO2 96%   BMI 36.58 kg/m?  ?Wt Readings from Last 3 Encounters:  ?01/09/22 200 lb (90.7 kg)  ?11/21/21 199 lb 8 oz (90.5 kg)  ?11/14/21 204 lb 9.6 oz (92.8 kg)  ? ? ?   ?Assessment & Plan:  ? ?Problem List Items Addressed This Visit   ? ?  ? Cardiovascular and Mediastinum  ? Hypertension associated with diabetes (HCC)  ?  BP controlled. Continue current medications. Check labs and follow up ? ?  ?  ? Relevant Orders  ? CBC with Differential/Platelet (Completed)  ?  ? Endocrine  ? Diabetes mellitus (HCC) - Primary  ?  Hgb A1c 7.8% approx 2 months ago. Continue metformin tid and do not start on glipizide. She will report back any low readings. Avoid fasting while taking metformin.  ?Follow up in 4 weeks.  ? ?  ?  ? Relevant  Orders  ? CBC with Differential/Platelet (Completed)  ? Comprehensive metabolic panel (Completed)  ? TSH (Completed)  ? Vitamin B12 (Completed)  ? VAS [place]  ABI WITH/WO TBI  ? Hyperlipidemia associated with type 2 diabetes mellitus (HCC)  ?  She is not currently taking a statin. She does not recall taking one in the past. Check lipids and start on statin therapy.  ? ?  ?  ? Relevant Orders  ? Lipid panel (Completed)  ?  ? Other  ? Bilateral leg cramps  ?  Check labs and ABIs ? ?  ?  ? Relevant Orders  ? Vitamin B12 (Completed)  ? Magnesium (Completed)  ? VAS [place] ABI WITH/WO TBI  ? Language barrier  ? Leg pain, bilateral  ?  Nocturnal pain. No sign of DVT or obstruction. No edema. No claudication. Check labs and ABIs and follow up ? ?  ?  ? Relevant Orders  ? VAS [place] ABI WITH/WO TBI  ? Vitamin D deficiency  ?  Continue vitamin D supplement and adjust dose as needed pending serum vitamin D today ? ?  ?  ? Relevant Orders  ? VITAMIN D 25 Hydroxy (Vit-D Deficiency, Fractures) (Completed)  ? ?Visit time 30 minutes in face to face communication with patient and coordination of care, additional 16 minutes spent in record review, coordination or care, ordering tests, communicating/referring to other healthcare professionals, documenting in medical records all on the same day of the visit for total time 46 minutes spent on the visit.  ? ? ?I am having Ricka Burdock maintain her Vitamin D (Ergocalciferol), famotidine, amLODipine, losartan-hydrochlorothiazide, furosemide, metFORMIN, glipiZIDE, Magnesium Citrate, and Multiple Vitamins-Minerals (MULTIVITAMIN WOMEN PO). ? ?No orders of the defined types were placed in this encounter. ? ? ?

## 2022-01-09 NOTE — Assessment & Plan Note (Signed)
Nocturnal pain. No sign of DVT or obstruction. No edema. No claudication. Check labs and ABIs and follow up ?

## 2022-01-09 NOTE — Assessment & Plan Note (Signed)
Check labs and ABIs ?

## 2022-01-10 ENCOUNTER — Other Ambulatory Visit: Payer: Self-pay | Admitting: Family Medicine

## 2022-01-10 DIAGNOSIS — E785 Hyperlipidemia, unspecified: Secondary | ICD-10-CM

## 2022-01-10 MED ORDER — ATORVASTATIN CALCIUM 10 MG PO TABS: 10.0000 mg | ORAL_TABLET | Freq: Every day | ORAL | 1 refills | Status: AC

## 2022-01-15 ENCOUNTER — Ambulatory Visit: Payer: Self-pay | Admitting: Nurse Practitioner

## 2022-02-05 ENCOUNTER — Ambulatory Visit (HOSPITAL_COMMUNITY)
Admission: RE | Admit: 2022-02-05 | Discharge: 2022-02-05 | Disposition: A | Discharge status: Home / Self Care | Payer: Self-pay | Source: Ambulatory Visit | Attending: Family Medicine | Admitting: Family Medicine

## 2022-02-05 DIAGNOSIS — M79604 Pain in right leg: Secondary | ICD-10-CM | POA: Insufficient documentation

## 2022-02-05 DIAGNOSIS — M79605 Pain in left leg: Secondary | ICD-10-CM | POA: Insufficient documentation

## 2022-02-05 DIAGNOSIS — R252 Cramp and spasm: Secondary | ICD-10-CM | POA: Insufficient documentation

## 2022-02-05 DIAGNOSIS — E1169 Type 2 diabetes mellitus with other specified complication: Secondary | ICD-10-CM | POA: Insufficient documentation

## 2022-02-06 NOTE — Progress Notes (Signed)
Her vascular ultrasound study shows good blood flow in both lower legs.

## 2022-02-13 ENCOUNTER — Ambulatory Visit (INDEPENDENT_AMBULATORY_CARE_PROVIDER_SITE_OTHER): Payer: Self-pay

## 2022-02-13 ENCOUNTER — Encounter: Payer: Self-pay | Admitting: Family Medicine

## 2022-02-13 ENCOUNTER — Ambulatory Visit (INDEPENDENT_AMBULATORY_CARE_PROVIDER_SITE_OTHER): Payer: Self-pay | Admitting: Family Medicine

## 2022-02-13 ENCOUNTER — Telehealth: Payer: Self-pay | Admitting: Family Medicine

## 2022-02-13 VITALS — BP 144/88 | HR 70 | Temp 97.8°F | Ht 62.0 in | Wt 201.0 lb

## 2022-02-13 DIAGNOSIS — R058 Other specified cough: Secondary | ICD-10-CM

## 2022-02-13 DIAGNOSIS — E785 Hyperlipidemia, unspecified: Secondary | ICD-10-CM

## 2022-02-13 DIAGNOSIS — E119 Type 2 diabetes mellitus without complications: Secondary | ICD-10-CM

## 2022-02-13 DIAGNOSIS — E1169 Type 2 diabetes mellitus with other specified complication: Secondary | ICD-10-CM

## 2022-02-13 DIAGNOSIS — K219 Gastro-esophageal reflux disease without esophagitis: Secondary | ICD-10-CM

## 2022-02-13 DIAGNOSIS — Z789 Other specified health status: Secondary | ICD-10-CM

## 2022-02-13 DIAGNOSIS — I152 Hypertension secondary to endocrine disorders: Secondary | ICD-10-CM

## 2022-02-13 DIAGNOSIS — E1159 Type 2 diabetes mellitus with other circulatory complications: Secondary | ICD-10-CM

## 2022-02-13 LAB — POCT GLYCOSYLATED HEMOGLOBIN (HGB A1C): Hemoglobin A1C: 5.6 % (ref 4.0–5.6)

## 2022-02-13 MED ORDER — AZITHROMYCIN 250 MG PO TABS: ORAL_TABLET | ORAL | 0 refills | Status: AC

## 2022-02-13 NOTE — Telephone Encounter (Signed)
Daughter called back to let us know pt has been taking glipizide. Would you like pt to continue on this medication or should we send in Metformin?

## 2022-02-13 NOTE — Telephone Encounter (Signed)
States pt has been taking Glipizide.   I informed pt that this medication was d/c today by provider.   States she is going to start back taking Metformin- wants it sent to the pharmacy with the correct instructions of 2x daily.   Please send to:  [facility] [address] [place] - [address] Phone:  [phone]  Fax:  [phone]

## 2022-02-13 NOTE — Assessment & Plan Note (Signed)
Patient and daughter prefer that daughter is interpreter instead of medical interpreter due to uncommon Arabic language.

## 2022-02-13 NOTE — Patient Instructions (Addendum)
Go downstairs to the first floor for your chest x-ray before you leave today.  Take the antibiotic as prescribed.  Follow-up in 3 to 4 weeks if your cough has not improved or sooner if it is getting worse.  Keep an eye on your blood pressure at home.  It is elevated today.  Continue your current blood pressure medications.    Diabetes is well controlled. Hemoglobin A1c is 5.6%.   Reduce metformin with breakfast and evening meal only now (2 times daily)

## 2022-02-13 NOTE — Assessment & Plan Note (Signed)
Continue amlodipine 10 mg, Lasix 20 mg bid, losartan - HCTZ 50-12.5 mg daily  BP not at goal today. Check BP at home and let me know if not controlled.

## 2022-02-13 NOTE — Progress Notes (Signed)
Subjective:     Patient ID: Christina Dalton, female    DOB: 12-26-1950, 70 y.o.   MRN: [id number]  Chief Complaint  Patient presents with   Diabetes    F/u on diabetes, has been taking her metformin and stopped glipizide as requested. Sugars range from 94-150 when she hasnt taken her medication   Cough    dry cough has been going on for about 3 months now and is beginning to effect her sleep    HPI Patient is in today for follow up on diabetes and other chronic health conditions including HTN, HL and GERD.   We stopped glipizide at her previous visit and she was to take metformin 500 mg tid. Today she reports she is not taking metformin and is not sure of the medication she is taking for her diabetes.   Reports starting atorvastatin 10 mg 4 weeks ago.   Taking amlodipine 10 mg, Lasix 20 mg bid, losartan - HCTZ 50-12.5 mg daily    New complaint today is dry cough x 3 months, occasional mucous. Is not taking anything for it.  She was taking Zyrtec but stopped several weeks ago.   Cough is worse at night.  Denies acid reflux.   Denies fever, chills, dizziness, chest pain, palpitations, shortness of breath, orthopnea, PND, abdominal pain, N/V/D, urinary symptoms, LE edema.      Health Maintenance Due  Topic Date Due   OPHTHALMOLOGY EXAM  Never done   Pneumonia Vaccine 37+ Years old (1 - PCV) Never done    Past Medical History:  Diagnosis Date   Acid reflux 09/2019   Coronary artery disease    Hypertension    Vitamin D deficiency 09/2019    Past Surgical History:  Procedure Laterality Date   CARDIAC CATHETERIZATION     COLON SURGERY      History reviewed. No pertinent family history.  Social History   Socioeconomic History   Marital status: Married    Spouse name: Not on file   Number of children: Not on file   Years of education: Not on file   Highest education level: Not on file  Occupational History   Not on file  Tobacco Use   Smoking status: Never    Smokeless tobacco: Never  Vaping Use   Vaping Use: Never used  Substance and Sexual Activity   Alcohol use: No   Drug use: No   Sexual activity: Not on file  Other Topics Concern   Not on file  Social History Narrative   Not on file   Social Determinants of Health   Financial Resource Strain: Not on file  Food Insecurity: Not on file  Transportation Needs: Not on file  Physical Activity: Not on file  Stress: Not on file  Social Connections: Not on file  Intimate Partner Violence: Not on file    Outpatient Medications Prior to Visit  Medication Sig Dispense Refill   amLODipine (NORVASC) 10 MG tablet Take 1 tablet (10 mg total) by mouth daily. 30 tablet 2   atorvastatin (LIPITOR) 10 MG tablet Take 1 tablet (10 mg total) by mouth daily. 90 tablet 1   famotidine (PEPCID) 20 MG tablet Take 1 tablet (20 mg total) by mouth 2 (two) times daily as needed for heartburn or indigestion. 60 tablet 6   furosemide (LASIX) 20 MG tablet Take 1 tablet (20 mg total) by mouth 2 (two) times daily. 60 tablet 2   losartan-hydrochlorothiazide (HYZAAR) 50-12.5 MG tablet Take 1 tablet  by mouth daily. 30 tablet 2   Magnesium Citrate 200 MG TABS Take by mouth. Take 2 tablets (400 mg) once daily.     metFORMIN (GLUCOPHAGE) 500 MG tablet Take 1 tablet (500 mg total) by mouth 3 (three) times daily before meals. (Patient taking differently: Take 500 mg by mouth in the morning and at bedtime. 2 times daily with meals) 90 tablet 2   Multiple Vitamins-Minerals (MULTIVITAMIN WOMEN PO) Take by mouth.     Vitamin D, Ergocalciferol, (DRISDOL) 1.25 MG (50000 UNIT) CAPS capsule Take 1 capsule (50,000 Units total) by mouth every 7 (seven) days. 5 capsule 6   glipiZIDE (GLUCOTROL) 5 MG tablet Take 1 tablet (5 mg total) by mouth 2 (two) times daily before a meal. (Patient not taking: Reported on 02/13/2022) 60 tablet 3   No facility-administered medications prior to visit.    No Known Allergies  ROS     Objective:     Physical Exam Constitutional:      General: She is not in acute distress.    Appearance: She is not ill-appearing or diaphoretic.  Eyes:     Conjunctiva/sclera: Conjunctivae normal.     Pupils: Pupils are equal, round, and reactive to light.  Cardiovascular:     Rate and Rhythm: Normal rate and regular rhythm.     Pulses: Normal pulses.     Heart sounds: Normal heart sounds.  Pulmonary:     Effort: Pulmonary effort is normal.     Breath sounds: Normal breath sounds.  Musculoskeletal:        General: Normal range of motion.     Right lower leg: No edema.     Left lower leg: No edema.  Skin:    General: Skin is warm and dry.  Neurological:     General: No focal deficit present.     Mental Status: She is alert and oriented to person, place, and time.  Psychiatric:        Mood and Affect: Mood normal.        Behavior: Behavior normal.        Thought Content: Thought content normal.     BP (!) 144/88 (BP Location: Left Arm, Patient Position: Sitting, Cuff Size: Large)   Pulse 70   Temp 97.8 F (36.6 C) (Temporal)   Ht 5\' 2"  (1.575 m)   Wt 201 lb (91.2 kg)   SpO2 95%   BMI 36.76 kg/m  Wt Readings from Last 3 Encounters:  02/13/22 201 lb (91.2 kg)  01/09/22 200 lb (90.7 kg)  11/21/21 199 lb 8 oz (90.5 kg)        Assessment & Plan:   Problem List Items Addressed This Visit       Cardiovascular and Mediastinum   Hypertension associated with diabetes (HCC)    Continue amlodipine 10 mg, Lasix 20 mg bid, losartan - HCTZ 50-12.5 mg daily  BP not at goal today. Check BP at home and let me know if not controlled.         Digestive   Gastroesophageal reflux disease without esophagitis    Controlled. Continue famotidine.         Endocrine   Diabetes mellitus (HCC)    Hgb A1c 5.6% and significantly improved from 3 months ago. She is unsure which medication she is taking and will call back to let me know. Denies hypoglycemia.        Relevant Orders   POCT HgB  A1C (Completed)   Hyperlipidemia associated  with type 2 diabetes mellitus (HCC)    Started on statin 4 weeks ago. No issue and she will continue atorvastatin 10 mg daily. Check lipids at next follow up visit.         Other   Cough present for greater than 3 weeks - Primary    Dry cough x 3 months. Benign exam. She is euvolemic. Chest XR ordered. Normal labs which were reviewed from previous visit 4 weeks ago. Z-pak prescribed.  May start back on Zyrtec. Follow up if worsening or not improved after completing antibiotic.       Relevant Medications   azithromycin (ZITHROMAX) 250 MG tablet   Other Relevant Orders   DG Chest 2 View (Completed)   Language barrier    Patient and daughter prefer that daughter is interpreter instead of medical interpreter due to uncommon Arabic language.        I have discontinued Teliyah Guarnieri's glipiZIDE. I am also having her start on azithromycin. Additionally, I am having her maintain her Vitamin D (Ergocalciferol), famotidine, amLODipine, losartan-hydrochlorothiazide, furosemide, metFORMIN, Magnesium Citrate, Multiple Vitamins-Minerals (MULTIVITAMIN WOMEN PO), and atorvastatin.  Meds ordered this encounter  Medications   azithromycin (ZITHROMAX) 250 MG tablet    Sig: Take 2 tablets on day 1, then 1 tablet daily on days 2 through 5    Dispense:  6 tablet    Refill:  0    Order Specific Question:   Supervising Provider    Answer:   Hillard Danker A [[id number]]

## 2022-02-13 NOTE — Assessment & Plan Note (Signed)
Dry cough x 3 months. Benign exam. She is euvolemic. Chest XR ordered. Normal labs which were reviewed from previous visit 4 weeks ago. Z-pak prescribed.  May start back on Zyrtec. Follow up if worsening or not improved after completing antibiotic.

## 2022-02-13 NOTE — Assessment & Plan Note (Signed)
Controlled. Continue famotidine.

## 2022-02-13 NOTE — Assessment & Plan Note (Signed)
Started on statin 4 weeks ago. No issue and she will continue atorvastatin 10 mg daily. Check lipids at next follow up visit.

## 2022-02-13 NOTE — Assessment & Plan Note (Signed)
Hgb A1c 5.6% and significantly improved from 3 months ago. She is unsure which medication she is taking and will call back to let me know. Denies hypoglycemia.

## 2022-02-14 NOTE — Telephone Encounter (Signed)
Pt is having side effects from metformin and would like to know if it is ok to go back on glipizide?

## 2022-02-14 NOTE — Telephone Encounter (Signed)
Caller states having side effects from Metformin.Requesting to start back on Glipizide  Please advise

## 2022-02-15 NOTE — Telephone Encounter (Signed)
LM for pt regarding instructions below. Provided office number in case pt has questions or concerns and to please let us know if she needs refills.

## 2022-02-20 ENCOUNTER — Ambulatory Visit: Payer: Self-pay | Admitting: Nurse Practitioner

## 2022-02-26 ENCOUNTER — Other Ambulatory Visit: Payer: Self-pay | Admitting: Nurse Practitioner

## 2022-02-26 ENCOUNTER — Telehealth: Payer: Self-pay | Admitting: Family Medicine

## 2022-02-26 DIAGNOSIS — I10 Essential (primary) hypertension: Secondary | ICD-10-CM

## 2022-02-26 DIAGNOSIS — R609 Edema, unspecified: Secondary | ICD-10-CM

## 2022-02-26 NOTE — Telephone Encounter (Signed)
Per medication chart pt should have another refill at pharmacy.Marland Kitchen tried to call pharmacy and they are closed stating they reopen at 10 am today?? Will try to call again later

## 2022-02-26 NOTE — Telephone Encounter (Signed)
Pt's daughter called to request an RX refill of atorvastatin (LIPITOR) 10 MG tablet.  Please send RX to [facility] [address] [place], [place]  Phone:  [phone] Fax: [phone]

## 2022-02-28 NOTE — Telephone Encounter (Signed)
Called pharmacy and confirmed pt has another refill. Last prescription was sent for 90 days, pt shouldn't need to fill refill until 03/12/22.

## 2022-02-28 NOTE — Telephone Encounter (Signed)
LM letting pt know she has another refill left at [facility] pharmacy

## 2022-04-24 ENCOUNTER — Other Ambulatory Visit: Payer: Self-pay | Admitting: Nurse Practitioner

## 2022-04-24 DIAGNOSIS — R609 Edema, unspecified: Secondary | ICD-10-CM

## 2022-04-24 DIAGNOSIS — E559 Vitamin D deficiency, unspecified: Secondary | ICD-10-CM

## 2022-04-24 DIAGNOSIS — I10 Essential (primary) hypertension: Secondary | ICD-10-CM

## 2022-04-24 DIAGNOSIS — E1169 Type 2 diabetes mellitus with other specified complication: Secondary | ICD-10-CM

## 2022-04-27 NOTE — Telephone Encounter (Signed)
ATC pt with Arabic interpreter Sarah. VM box was not set up so unable to leave LM.

## 2023-03-04 IMAGING — DX DG CHEST 2V
2 series · 2 of 2 positions shown · non-contrast
Comparison: July 12, 2019

CLINICAL DATA: cough x 3 months

EXAM:
CHEST - 2 VIEW

[chest pa]
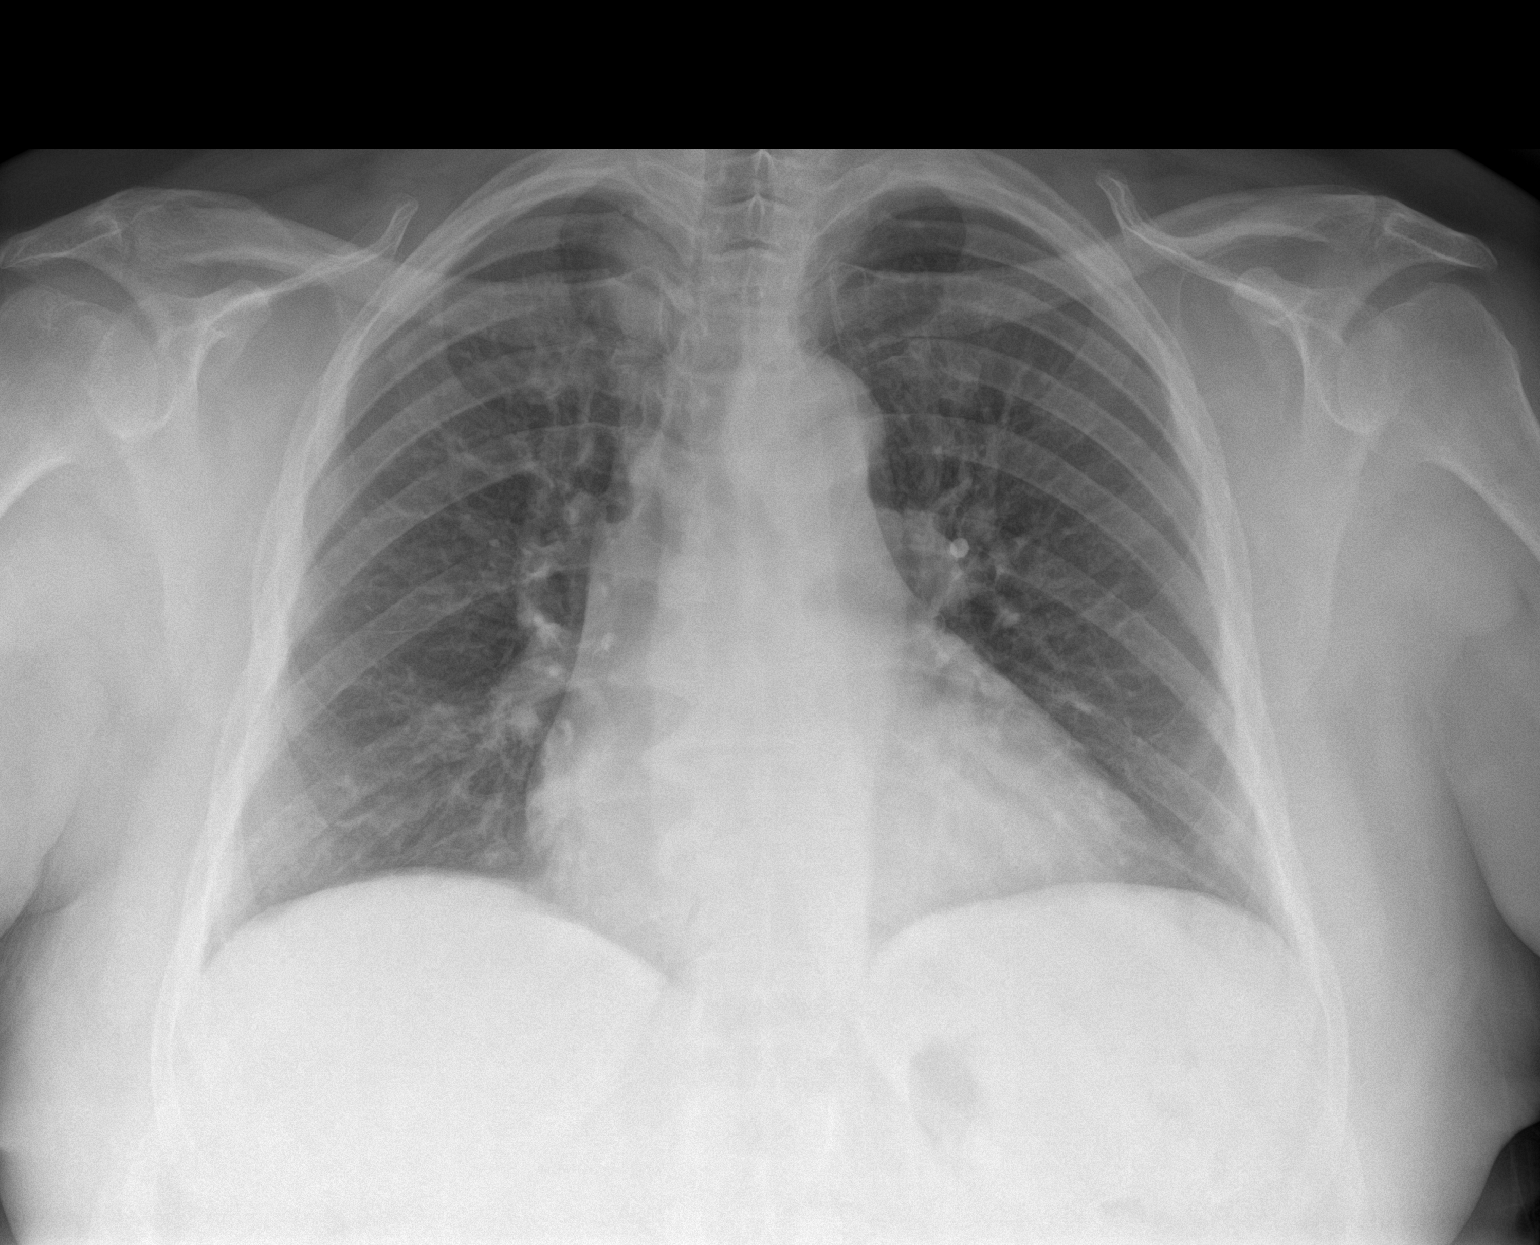

[chest lat]
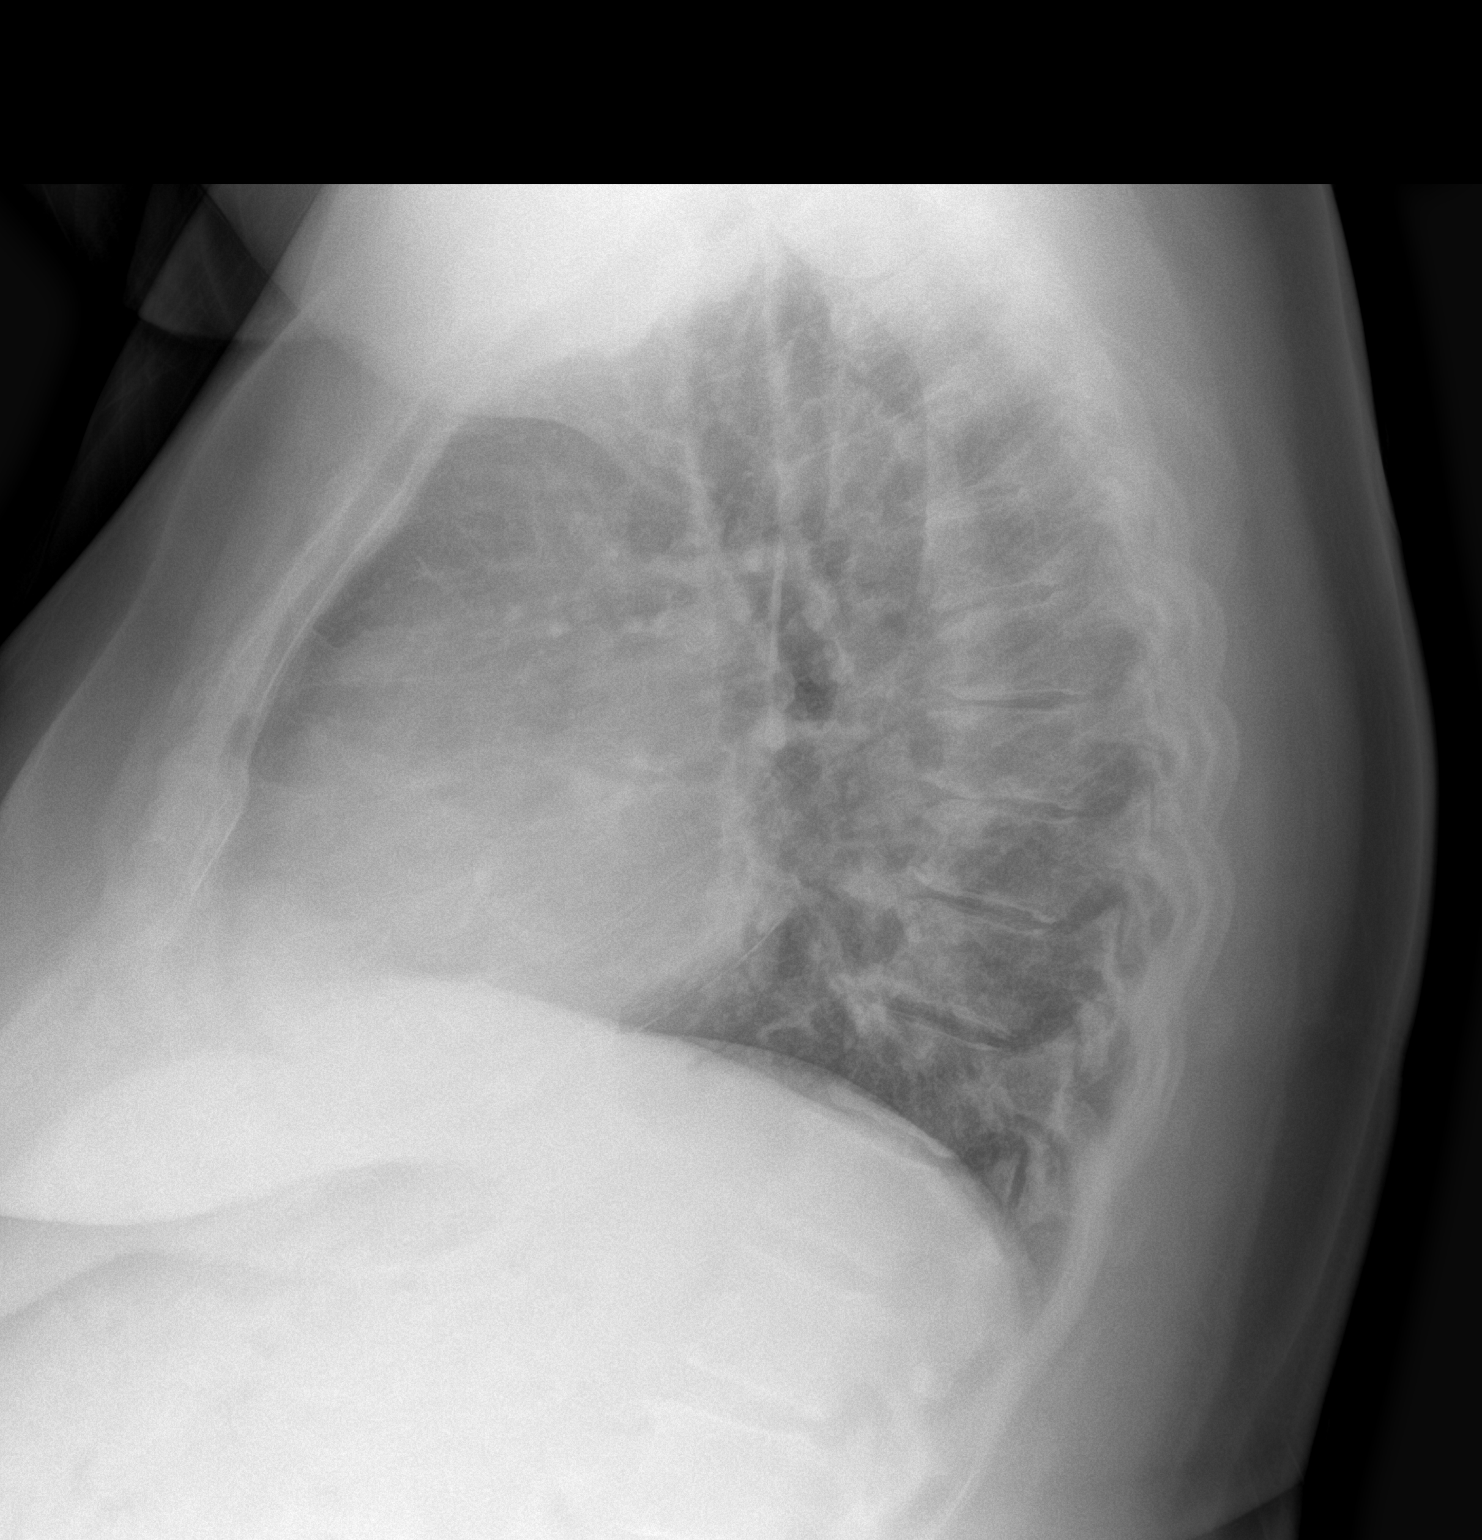

[2 of 2 positions shown; findings below may reference images not displayed]

FINDINGS: The heart size and mediastinal contours are stable with mild
cardiomegaly. Both lungs are clear. The visualized skeletal
structures are unremarkable.
IMPRESSION: No active cardiopulmonary disease.
